# Patient Record
Sex: Female | Born: 1985 | Race: White | Hispanic: No | Marital: Married | Smoking: Never smoker
Health system: Southern US, Community
[De-identification: ages and names within clinical notes are randomized; demographics above are authoritative.]

## PROBLEM LIST (undated history)

## (undated) DIAGNOSIS — O039 Complete or unspecified spontaneous abortion without complication: Secondary | ICD-10-CM

## (undated) HISTORY — DX: Complete or unspecified spontaneous abortion without complication: O03.9

---

## 2007-12-30 ENCOUNTER — Emergency Department: Payer: Self-pay | Admitting: Emergency Medicine

## 2009-06-10 ENCOUNTER — Emergency Department: Payer: Self-pay | Admitting: Emergency Medicine

## 2011-07-05 ENCOUNTER — Emergency Department: Payer: Self-pay | Admitting: Emergency Medicine

## 2011-07-07 LAB — BETA STREP CULTURE(ARMC)

## 2015-01-05 ENCOUNTER — Emergency Department: Payer: Self-pay

## 2015-01-05 ENCOUNTER — Emergency Department
Admission: EM | Admit: 2015-01-05 | Discharge: 2015-01-05 | Disposition: A | Discharge status: Home / Self Care | Payer: Self-pay | Attending: Emergency Medicine | Admitting: Emergency Medicine

## 2015-01-05 ENCOUNTER — Encounter: Payer: Self-pay | Admitting: *Deleted

## 2015-01-05 DIAGNOSIS — O200 Threatened abortion: Secondary | ICD-10-CM | POA: Insufficient documentation

## 2015-01-05 DIAGNOSIS — Z3A01 Less than 8 weeks gestation of pregnancy: Secondary | ICD-10-CM | POA: Insufficient documentation

## 2015-01-05 DIAGNOSIS — N939 Abnormal uterine and vaginal bleeding, unspecified: Secondary | ICD-10-CM

## 2015-01-05 LAB — BASIC METABOLIC PANEL
ANION GAP: 7 (ref 5–15)
BUN: 5 mg/dL — AB (ref 6–20)
CO2: 25 mmol/L (ref 22–32)
CREATININE: 0.52 mg/dL (ref 0.44–1.00)
Calcium: 9 mg/dL (ref 8.9–10.3)
Chloride: 105 mmol/L (ref 101–111)
GFR calc Af Amer: >60 mL/min (ref >60)
Glucose, Bld: 100 mg/dL — ABNORMAL HIGH (ref 65–99)
Potassium: 3.6 mmol/L (ref 3.5–5.1)
Sodium: 137 mmol/L (ref 135–145)

## 2015-01-05 LAB — CBC WITH DIFFERENTIAL/PLATELET
Basophils Absolute: 0.1 10*3/uL (ref 0–0.1)
Basophils Relative: 1 %
EOS PCT: 1 %
Eosinophils Absolute: 0.2 10*3/uL (ref 0–0.7)
HCT: 38.9 % (ref 35.0–47.0)
Hemoglobin: 13.4 g/dL (ref 12.0–16.0)
LYMPHS PCT: 22 %
Lymphs Abs: 2.4 10*3/uL (ref 1.0–3.6)
MCH: 29.2 pg (ref 26.0–34.0)
MCHC: 34.5 g/dL (ref 32.0–36.0)
MCV: 84.8 fL (ref 80.0–100.0)
MONO ABS: 0.4 10*3/uL (ref 0.2–0.9)
MONOS PCT: 4 %
NEUTROS ABS: 7.9 10*3/uL — AB (ref 1.4–6.5)
Neutrophils Relative %: 72 %
Platelets: 297 10*3/uL (ref 150–440)
RBC: 4.59 MIL/uL (ref 3.80–5.20)
RDW: 13 % (ref 11.5–14.5)
WBC: 11 10*3/uL (ref 3.6–11.0)

## 2015-01-05 LAB — URINALYSIS COMPLETE WITH MICROSCOPIC (ARMC ONLY)
BACTERIA UA: NONE SEEN
Bilirubin Urine: NEGATIVE
GLUCOSE, UA: NEGATIVE mg/dL
LEUKOCYTES UA: NEGATIVE
Nitrite: NEGATIVE
Protein, ur: 30 mg/dL — AB
Specific Gravity, Urine: 1.021 (ref 1.005–1.030)
pH: 5 (ref 5.0–8.0)

## 2015-01-05 LAB — HCG, QUANTITATIVE, PREGNANCY: hCG, Beta Chain, Quant, S: 625 m[IU]/mL — ABNORMAL HIGH (ref <5)

## 2015-01-05 LAB — POCT PREGNANCY, URINE: PREG TEST UR: POSITIVE — AB

## 2015-01-05 NOTE — ED Notes (Signed)
Dr. Lord at bedside.  

## 2015-01-05 NOTE — ED Provider Notes (Signed)
[facility] Emergency Department Provider Note   ____________________________________________  Time seen:  I have reviewed the triage vital signs and the triage nursing note.  HISTORY  Chief Complaint Vaginal Bleeding   Historian Patient  HPI Barbara Huff is a 29 y.o. female is here for evaluation of vaginal bleeding since mid November, as well as intermittent left lower quadrant cramping. Patient works at health care facility and this afternoon felt lightheadedness and then nausea with near syncope.  No abdominal pain currently. No headache. No traumatic injury.Symptoms are mild. No exacerbating or alleviating factors.    No past medical history on file.  There are no active problems to display for this patient.   No past surgical history on file.  No current outpatient prescriptions on file.  Allergies Codeine  No family history on file.  Social History Social History  Substance Use Topics  . Smoking status: Never Smoker   . Smokeless tobacco: Not on file  . Alcohol Use: No    Review of Systems  Constitutional: Negative for fever. Eyes: Negative for visual changes. ENT: Negative for sore throat. Cardiovascular: Negative for chest pain. Respiratory: Negative for shortness of breath. Gastrointestinal: Negative for constipation or diarrhea. Genitourinary: Negative for dysuria. Musculoskeletal: Negative for back pain. Skin: Negative for rash. Neurological: Negative for headache. 10 point Review of Systems otherwise negative ____________________________________________   PHYSICAL EXAM:  VITAL SIGNS: ED Triage Vitals  Enc Vitals Group     BP 01/05/15 1936 121/78 mmHg     Pulse Rate 01/05/15 1936 76     Resp --      Temp 01/05/15 1936 98.1 F (36.7 C)     Temp Source 01/05/15 1936 Oral     SpO2 01/05/15 1936 100 %     Weight 01/05/15 1936 178 lb (80.74 kg)     Height 01/05/15 1936 4\' 11"  (1.499 m)     Head Cir --      Peak  Flow --      Pain Score 01/05/15 1936 6     Pain Loc --      Pain Edu? --      Excl. in GC? --      Constitutional: Alert and oriented. Well appearing and in no distress. Eyes: Conjunctivae are normal. PERRL. Normal extraocular movements. ENT   Head: Normocephalic and atraumatic.   Nose: No congestion/rhinnorhea.   Mouth/Throat: Mucous membranes are moist.   Neck: No stridor. Cardiovascular/Chest: Normal rate, regular rhythm.  No murmurs, rubs, or gallops. Respiratory: Normal respiratory effort without tachypnea nor retractions. Breath sounds are clear and equal bilaterally. No wheezes/rales/rhonchi. Gastrointestinal: Soft. No distention, no guarding, no rebound. Nontender  Genitourinary/rectal: Small amount of dark blood in vault. Cervix closed. Nontender cervix. No cervical motion tenderness. No adnexal mass or tenderness. Musculoskeletal: Nontender with normal range of motion in all extremities. No joint effusions.  No lower extremity tenderness.  No edema. Neurologic:  Normal speech and language. No gross or focal neurologic deficits are appreciated. Skin:  Skin is warm, dry and intact. No rash noted. Psychiatric: Mood and affect are normal. Speech and behavior are normal. Patient exhibits appropriate insight and judgment.  ____________________________________________   EKG I, Governor Rooksebecca Shresta Risden, MD, the attending physician have personally viewed and interpreted all ECGs.  None ____________________________________________  LABS (pertinent positives/negatives)  Urine pregnancy test positive CBC within normal limits Basic metabolic panel within normal limits Urinalysis trace ketones and otherwise negative Blood type AB+ Beta hCG 625  ____________________________________________  RADIOLOGY  All Xrays were viewed by me. Imaging interpreted by Radiologist.  Ultrasound complete less than 14 weeks with transvaginal:  IMPRESSION: Pregnancy location not visualized  sonographically. Differential diagnosis includes recent spontaneous abortion, IUP too early to visualize, and non-visualized ectopic pregnancy. Recommend close follow up of quantitative B-HCG levels, and follow up US as clinically warranted. __________________________________________  PROCEDURES  Procedure(s) performed: None  Critical Care performed: None  ____________________________________________   ED COURSE / ASSESSMENT AND PLAN  CONSULTATIONS: None  Pertinent labs & imaging results that were available during my care of the patient were reviewed by me and considered in my medical decision making (see chart for details).  Patient is here for her near syncopal episode and also bleeding for the last couple weeks. She is not significantly anemic. She is asymptomatic now here in the emergency department.  Pregnancy test positive with a new diagnosis. She is G1.  My clinical suspicion given the bleeding for the last several weeks as well as an nonvisualized pregnancy, I am most suspicious that this is probably a miscarriage. However given the possibility for early pregnancy or early tubal pregnancy, patient will be referred to OB/GYN for 2 day repeat beta hCG.  Patient / Family / Caregiver informed of clinical course, medical decision-making process, and agree with plan.   I discussed return precautions, follow-up instructions, and discharged instructions with patient and/or family.  ___________________________________________   FINAL CLINICAL IMPRESSION(S) / ED DIAGNOSES   Final diagnoses:  Vaginal bleeding  Miscarriage, threatened, early pregnancy       Governor Rooks, MD 01/05/15 2210

## 2015-01-05 NOTE — ED Notes (Signed)
Pt in ultrasound at this time. US tKoreaech notified to bring pt to treatment room 31 when ultrasound complete. US tech verbalized understanding.

## 2015-01-05 NOTE — ED Notes (Signed)
Pt has vaginal bleeding since November 15th.  Pt also has low abd pain with cramping.  Vomited once today.  No dysuria.  No back pain.

## 2015-01-05 NOTE — Discharge Instructions (Signed)
You were evaluated for vaginal bleeding and found to have positive pregnancy test in the ER.  Your pregnancy hormone level is relatively low, and your ultrasound does not show fetus/heartbeat.  This could indicate early pregnancy prior to the size able to be seen, or the middle of a miscarriage, or the early stages of a tubal pregnancy.  You need repeat bloodwork (Beta HCG) in 2 days -- call Dr. Oretha Milchherry's office ob gyn, to make appointment in 2 days.  Return to the emergency department for any new or worsening condition including any heavier bleeding, dizziness, passing out, or any other symptoms concerning to you.   Vaginal Bleeding During Pregnancy, First Trimester A small amount of bleeding (spotting) from the vagina is common in early pregnancy. Sometimes the bleeding is normal and is not a problem, and sometimes it is a sign of something serious. Be sure to tell your doctor about any bleeding from your vagina right away. HOME CARE  Watch your condition for any changes.  Follow your doctor's instructions about how active you can be.  If you are on bed rest:  You may need to stay in bed and only get up to use the bathroom.  You may be allowed to do some activities.  If you need help, make plans for someone to help you.  Write down:  The number of pads you use each day.  How often you change pads.  How soaked (saturated) your pads are.  Do not use tampons.  Do not douche.  Do not have sex or orgasms until your doctor says it is okay.  If you pass any tissue from your vagina, save the tissue so you can show it to your doctor.  Only take medicines as told by your doctor.  Do not take aspirin because it can make you bleed.  Keep all follow-up visits as told by your doctor. GET HELP IF:   You bleed from your vagina.  You have cramps.  You have labor pains.  You have a fever that does not go away after you take medicine. GET HELP RIGHT AWAY IF:   You have very bad  cramps in your back or belly (abdomen).  You pass large clots or tissue from your vagina.  You bleed more.  You feel light-headed or weak.  You pass out (faint).  You have chills.  You are leaking fluid or have a gush of fluid from your vagina.  You pass out while pooping (having a bowel movement). MAKE SURE YOU:  Understand these instructions.  Will watch your condition.  Will get help right away if you are not doing well or get worse.   This information is not intended to replace advice given to you by your health care provider. Make sure you discuss any questions you have with your health care provider.   Document Released: 06/02/2013 Document Reviewed: 06/02/2013 Elsevier Interactive Patient Education [employer]2016 Elsevier Inc.

## 2015-01-07 ENCOUNTER — Encounter: Payer: Self-pay | Admitting: Obstetrics and Gynecology

## 2015-01-07 ENCOUNTER — Telehealth: Payer: Self-pay

## 2015-01-07 ENCOUNTER — Ambulatory Visit (INDEPENDENT_AMBULATORY_CARE_PROVIDER_SITE_OTHER): Payer: Self-pay | Admitting: Obstetrics and Gynecology

## 2015-01-07 ENCOUNTER — Other Ambulatory Visit: Payer: Self-pay | Admitting: Obstetrics and Gynecology

## 2015-01-07 VITALS — BP 129/76 | HR 86 | Ht 59.5 in | Wt 174.6 lb

## 2015-01-07 DIAGNOSIS — O021 Missed abortion: Secondary | ICD-10-CM

## 2015-01-07 DIAGNOSIS — O200 Threatened abortion: Secondary | ICD-10-CM

## 2015-01-07 LAB — BETA HCG QUANT (REF LAB): HCG QUANT: 345 m[IU]/mL

## 2015-01-07 MED ORDER — METHYLERGONOVINE MALEATE 0.2 MG PO TABS: 0.2000 mg | ORAL_TABLET | Freq: Four times a day (QID) | ORAL | Status: DC

## 2015-01-07 NOTE — Telephone Encounter (Signed)
Informed pt of miscarriage, and gave bleeding precautions. Advised pt on the need to use contraception for at least 3 mos before trying to conceive again. RX sent in.

## 2015-01-07 NOTE — Telephone Encounter (Signed)
-----   Message from Hildred LaserAnika Cherry, MD sent at 01/07/2015  4:07 PM EST ----- Please inform patient that BHCG levels are trending downward, looks like she is having a miscarriage.  Can prescribe Methergine 0.2 mg q 6 hr x 24 hrs (4 tablets) if she notes that her bleeding is still heavy.

## 2015-01-07 NOTE — Telephone Encounter (Signed)
ERROR

## 2015-01-10 LAB — ABO/RH: ABO/RH(D): AB POS

## 2015-01-11 ENCOUNTER — Encounter: Payer: Self-pay | Admitting: Obstetrics and Gynecology

## 2015-01-11 NOTE — Progress Notes (Signed)
GYNECOLOGY PROGRESS NOTE  Subjective:    Patient ID: Barbara Huff, female    DOB: [birth year], 29 y.o.   MRN: [id number]  HPI  Patient is a 29 y.o. G1P0 female who presents for follow from Emergency Room visit 2 days ago for heavy abnormal vaginal bleeding.  Bleeding has been ongoing since approximately mid November, was like a light period, however over last 2 weekends, bleeding increased and was associated with pelvic cramping.  Was discovered to be pregnant in Emergency Room but could not identify IUP due to low BHCG levels. Was instructed to f/u with GYN.    History reviewed. No pertinent past medical history.  No family history on file.  History reviewed. No pertinent past surgical history.  Social History   Social History  . Marital Status: Single    Spouse Name: N/A  . Number of Children: N/A  . Years of Education: N/A   Occupational History  . Not on file.   Social History Main Topics  . Smoking status: Never Smoker   . Smokeless tobacco: Not on file  . Alcohol Use: No  . Drug Use: Not on file  . Sexual Activity: Yes    Birth Control/ Protection: None   Other Topics Concern  . Not on file   Social History Narrative    Allergies  Allergen Reactions  . Codeine Nausea And Vomiting    No current outpatient prescriptions on file prior to visit.   No current facility-administered medications on file prior to visit.     Review of Systems Pertinent items noted in HPI and remainder of comprehensive ROS otherwise negative.   Objective:   Blood pressure 129/76, pulse 86, height 4' 11.5" (1.511 m), weight 174 lb 9.6 oz (79.198 kg), last menstrual period 12/15/2014. General appearance: alert and no distress Abdomen: soft, non-tender; bowel sounds normal; no masses,  no organomegaly Pelvic: external genitalia normal.  Vagina with small amount of dark red blood in vaginal vault, no clots.  Cervix closed, nontender, no lesions. Uterus ~ 6 week size, mobile. No adnexal  masses or tenderness. Extremities: extremities normal, atraumatic, no cyanosis or edema Neurologic: Grossly normal    Labs:  Lab Results  Component Value Date   ABORH AB POS 01/05/2015   Results for [name], Huff (MRN [id number]) as of 01/11/2015 10:49  Ref. Range 01/05/2015 19:47  HCG, Beta Chain, Quant, S Latest Ref Range: <5 mIU/mL 625 (H)       Component Value Date/Time   WBC 11.0 01/05/2015 1947   RBC 4.59 01/05/2015 1947   HGB 13.4 01/05/2015 1947   HCT 38.9 01/05/2015 1947   PLT 297 01/05/2015 1947   MCV 84.8 01/05/2015 1947   MCH 29.2 01/05/2015 1947   MCHC 34.5 01/05/2015 1947   RDW 13.0 01/05/2015 1947   LYMPHSABS 2.4 01/05/2015 1947   MONOABS 0.4 01/05/2015 1947   EOSABS 0.2 01/05/2015 1947   BASOSABS 0.1 01/05/2015 1947    Imaging (01/05/2015 Pelvic Ultrasound): FINDINGS: No intrauterine gestational sac or other fluid collection visualized in endometrial cavity. Endometrial thickness measures 5 mm. No fibroids identified.  Both ovaries are normal in appearance. No adnexal mass identified. Trace amount of free fluid noted.  IMPRESSION: Pregnancy location not visualized sonographically. Differential diagnosis includes recent spontaneous abortion, IUP too early to visualize, and non-visualized ectopic pregnancy. Recommend close follow up of quantitative B-HCG levels, and follow up US as clinically warranted. Assessment:   Threatened abortion  Plan:   Patient with early IUP with bleeding,  vs SAB, cannot r/o ectopic. Discussion had with patient regarding differential diagnosis.  Discussed bleeding and ectopic precautions.  For repeat BHCG today.  Will notify patient by phone of results and determine subsequent management based on results.  Advised on Tylenol prn for cramping.    Hildred LaserAnika Renan Danese, MD [facility]

## 2015-01-14 ENCOUNTER — Telehealth: Payer: Self-pay | Admitting: Obstetrics and Gynecology

## 2015-01-14 NOTE — Telephone Encounter (Signed)
Patient would like Methergine sent [facility] in [place] [address] instead. It is cheaper than [facility]. Thanks

## 2015-01-14 NOTE — Telephone Encounter (Signed)
Spoke with pt inquired about her bleeding as medication is to help with heavy bleeding and was prescribed over a week ago. Pt states that is having very minimal bleeding, only seeing a light pink ting when she wipes in the morning. Advised pt that there is no need for her to pick up the medication as it is for heavy bleeding. Pt gave verbal understanding.

## 2015-05-19 ENCOUNTER — Ambulatory Visit (INDEPENDENT_AMBULATORY_CARE_PROVIDER_SITE_OTHER): Payer: Self-pay | Admitting: Obstetrics and Gynecology

## 2015-05-19 ENCOUNTER — Encounter: Payer: Self-pay | Admitting: Obstetrics and Gynecology

## 2015-05-19 VITALS — BP 127/78 | HR 101 | Ht 59.5 in | Wt 179.0 lb

## 2015-05-19 DIAGNOSIS — N912 Amenorrhea, unspecified: Secondary | ICD-10-CM

## 2015-05-19 DIAGNOSIS — Z8759 Personal history of other complications of pregnancy, childbirth and the puerperium: Secondary | ICD-10-CM | POA: Insufficient documentation

## 2015-05-19 DIAGNOSIS — Z8742 Personal history of other diseases of the female genital tract: Secondary | ICD-10-CM

## 2015-05-19 DIAGNOSIS — O26851 Spotting complicating pregnancy, first trimester: Secondary | ICD-10-CM

## 2015-05-19 LAB — POCT URINE PREGNANCY: PREG TEST UR: POSITIVE — AB

## 2015-05-19 NOTE — Progress Notes (Signed)
GYN ENCOUNTER NOTE  Subjective:       Barbara Huff is a 30 y.o. G22P0010 female is here for gynecologic evaluation of the following issues:  1.Threatened abortion  Cycles are regular.    LMP 04/09/2015 Positive UPT Current EGA 5.5 weeks  Patient has noted nausea with occasional vomiting, increased breast tenderness. She denies pelvic pain, cramping. She does report episode of light vaginal spotting in the past 24 hours. Past obstetric history is notable for SAB in December 2016 Blood type: AB+  Gynecologic History Patient's last menstrual period was 04/09/2015 (exact date). Contraception: none  Obstetric History OB History  Gravida Para Term Preterm AB SAB TAB Ectopic Multiple Living  # Outcome Date GA Lbr Len/2nd Weight Sex Delivery Anes PTL Lv  1 SAB 12/2014              Past Medical History  Diagnosis Date  . SAB (spontaneous abortion)     Past Surgical History  Procedure Laterality Date  . No past surgeries      No current outpatient prescriptions on file prior to visit.   No current facility-administered medications on file prior to visit.    Allergies  Allergen Reactions  . Codeine Nausea And Vomiting    Social History   Social History  . Marital Status: Single    Spouse Name: N/A  . Number of Children: N/A  . Years of Education: N/A   Occupational History  . Not on file.   Social History Main Topics  . Smoking status: Never Smoker   . Smokeless tobacco: Not on file  . Alcohol Use: No  . Drug Use: No  . Sexual Activity: Yes    Birth Control/ Protection: None   Other Topics Concern  . Not on file   Social History Narrative    Family History  Problem Relation Age of Onset  . Asthma Maternal Grandmother   . Colon cancer Maternal Grandmother   . Diabetes Maternal Grandmother   . Ovarian cancer Neg Hx     The following portions of the patient's history were reviewed and updated as appropriate: allergies, current  medications, past family history, past medical history, past social history, past surgical history and problem list.  Review of Systems Review of Systems - General ROS: negative for - chills, fatigue, fever, hot flashes, malaise or night sweats Hematological and Lymphatic ROS: negative for - bleeding problems or swollen lymph nodes Gastrointestinal ROS: negative for - abdominal pain, blood in stools, change in bowel habits. POSITIVE- nausea/vomiting Musculoskeletal ROS: negative for - joint pain, muscle pain or muscular weakness Genito-Urinary ROS: negative for - change in menstrual cycle, dysmenorrhea, dyspareunia, dysuria, genital discharge, genital ulcers, hematuria, incontinence, irregular/heavy menses, nocturia or pelvic pain  Objective:   BP 127/78 mmHg  Pulse 101  Ht 4' 11.5" (1.511 m)  Wt 179 lb (81.194 kg)  BMI 35.56 kg/m2  LMP 04/09/2015 (Exact Date) CONSTITUTIONAL: Well-developed, well-nourished female in no acute distress.  HENT:  Normocephalic, atraumatic.  NECK: Normal range of motion, supple, no masses.  Normal thyroid.  SKIN: Skin is warm and dry. No rash noted. Not diaphoretic. No erythema. No pallor. NEUROLGIC: Alert and oriented to person, place, and time. PSYCHIATRIC: Normal mood and affect. Normal behavior. Normal judgment and thought content. CARDIOVASCULAR:Not Examined RESPIRATORY: Not Examined BREASTS: Not Examined ABDOMEN: Soft, non distended; Non tender.  No Organomegaly. PELVIC:  External Genitalia: Normal  BUS: Normal  Vagina: Normal; Minimal blood noted in vaginal vault  Cervix: Normal; no lesions; no blood loss; no cervical motion tenderness  Uterus: Top Normal size, shape,consistency, mobile, nontender  Adnexa: Normal; nonpalpable and nontender  RV: Normal external exam  Bladder: Nontender MUSCULOSKELETAL: Normal range of motion. No tenderness.  No cyanosis, clubbing, or edema.     Assessment:   1. Amenorrhea - POCT urine pregnancy  2.  Spotting in first trimester - Beta HCG, Quant - Beta HCG, Quant; Future   3. History of SAB  Plan:   1. Quantitative hCG 05/19/2015 and 05/21/2015 2. SAB/ectopic precautions given 3. Pelvic ultrasound will be ordered once discriminatory zone is reached (greater than 2000 miu).  A total of 15 minutes were spent face-to-face with the patient during this encounter and over half of that time dealt with counseling and coordination of care.  Herold HarmsMartin A Defrancesco, MD  Note: This dictation was prepared with Dragon dictation along with smaller phrase technology. Any transcriptional errors that result from this process are unintentional.

## 2015-05-19 NOTE — Patient Instructions (Signed)
1. Quantitative hCG titer today and in 2 days 2. Return if increased bleeding and/or pain develops 3. He will be notified by phone regarding blood test results and further management planning for scheduling of ultrasound

## 2015-05-20 ENCOUNTER — Telehealth: Payer: Self-pay | Admitting: *Deleted

## 2015-05-20 LAB — BETA HCG QUANT (REF LAB): hCG Quant: 1050 m[IU]/mL

## 2015-05-20 NOTE — Telephone Encounter (Signed)
Patient called and is inquiring about her lab results . Patient is requesting a call back. Call back number  [phone].

## 2015-05-20 NOTE — Telephone Encounter (Signed)
Pt aware of beta results. Will have 2nd beta tomrorrow. Per mad if beta is above 2000 will order dating scan.

## 2015-05-21 ENCOUNTER — Other Ambulatory Visit: Payer: Self-pay

## 2015-05-21 DIAGNOSIS — O26851 Spotting complicating pregnancy, first trimester: Secondary | ICD-10-CM

## 2015-05-22 LAB — BETA HCG QUANT (REF LAB): hCG Quant: 1500 m[IU]/mL

## 2015-05-24 ENCOUNTER — Telehealth: Payer: Self-pay | Admitting: Obstetrics and Gynecology

## 2015-05-24 DIAGNOSIS — O209 Hemorrhage in early pregnancy, unspecified: Secondary | ICD-10-CM

## 2015-05-24 NOTE — Telephone Encounter (Signed)
Pt aware of both beta results- Per mad pt is to have beta before appt on wed. U/s scheduled for 9:00. Will see mad at 11:00.

## 2015-05-24 NOTE — Telephone Encounter (Signed)
HEY WHEN I TOLD HER TO KEEP HER EED APPT W/ DR DE, SHE SAID HER LABS WERE SUPPOSED TO COME BACK TODAY AND YOU WERE GOING TO CALL HER. i TOLD HER THAT DR DEF WOULD DISCUSS THE LABS ON WED. SHE WANTED ME TO SEND A NOTE TO YOU TO CALL. SORRY

## 2015-05-26 ENCOUNTER — Encounter: Payer: Self-pay | Admitting: Obstetrics and Gynecology

## 2015-05-26 ENCOUNTER — Ambulatory Visit (INDEPENDENT_AMBULATORY_CARE_PROVIDER_SITE_OTHER): Payer: Self-pay | Admitting: Obstetrics and Gynecology

## 2015-05-26 ENCOUNTER — Ambulatory Visit (INDEPENDENT_AMBULATORY_CARE_PROVIDER_SITE_OTHER): Payer: Self-pay

## 2015-05-26 ENCOUNTER — Other Ambulatory Visit: Payer: Self-pay | Admitting: Obstetrics and Gynecology

## 2015-05-26 VITALS — BP 109/75 | HR 73 | Wt 176.4 lb

## 2015-05-26 DIAGNOSIS — O4691 Antepartum hemorrhage, unspecified, first trimester: Secondary | ICD-10-CM

## 2015-05-26 DIAGNOSIS — O209 Hemorrhage in early pregnancy, unspecified: Secondary | ICD-10-CM

## 2015-05-26 DIAGNOSIS — O200 Threatened abortion: Secondary | ICD-10-CM

## 2015-05-26 NOTE — Patient Instructions (Signed)
1. Quantitative hCG today 2. Return in 1 week for repeat ultrasound and further management planning 3. Return if vaginal bleeding or pelvic pain developed

## 2015-05-26 NOTE — Progress Notes (Addendum)
Chief complaint: 1. Threatened abortion  Patient presents for follow-up. Quantitative hCG studies:  05/19/2015 1050  05/21/2015 1500  Ultrasound today: Normal without evidence of intrauterine pregnancy or adnexal mass or free fluid  Patient had resolution of first trimester spotting after visit on 05/19/2015. She is not experiencing any pelvic pain. She continues to have breast tenderness and nausea.  Previous history is notable for ectopic pregnancy right tube; no treatment; spontaneous resolution  OBJECTIVE: BP 109/75 mmHg  Pulse 73  Wt 176 lb 7 oz (80.032 kg)  LMP 04/09/2015 (Exact Date) Pleasant white female in no acute distress. Visual exam-deferred  ASSESSMENT: 1. Threatened abortion 2. Pelvic ultrasound without evidence of intrauterine pregnancy; no signs or symptoms of ectopic pregnancy at this time; apparent appropriate rise in hCG titer over 48 hours as noted.  PLAN: 1. Quantitative hCG titer today 2. Return in 1 week for pelvic ultrasound and further management planning 3. Ectopic precautions given  A total of 15 minutes were spent face-to-face with the patient during this encounter and over half of that time dealt with counseling and coordination of care.  Herold HarmsMartin A Maymunah Stegemann, MD  Note: This dictation was prepared with Dragon dictation along with smaller phrase technology. Any transcriptional errors that result from this process are unintentional.

## 2015-05-27 LAB — BETA HCG QUANT (REF LAB): HCG QUANT: 2515 m[IU]/mL

## 2015-06-02 ENCOUNTER — Other Ambulatory Visit (INDEPENDENT_AMBULATORY_CARE_PROVIDER_SITE_OTHER): Payer: Self-pay

## 2015-06-02 ENCOUNTER — Other Ambulatory Visit: Payer: Self-pay

## 2015-06-02 ENCOUNTER — Ambulatory Visit: Payer: Self-pay | Admitting: Obstetrics and Gynecology

## 2015-06-02 ENCOUNTER — Ambulatory Visit (INDEPENDENT_AMBULATORY_CARE_PROVIDER_SITE_OTHER): Payer: Self-pay

## 2015-06-02 DIAGNOSIS — O200 Threatened abortion: Secondary | ICD-10-CM

## 2015-06-02 DIAGNOSIS — O209 Hemorrhage in early pregnancy, unspecified: Secondary | ICD-10-CM

## 2015-06-02 DIAGNOSIS — O4691 Antepartum hemorrhage, unspecified, first trimester: Secondary | ICD-10-CM

## 2015-06-03 ENCOUNTER — Telehealth: Payer: Self-pay | Admitting: *Deleted

## 2015-06-03 ENCOUNTER — Other Ambulatory Visit: Payer: Self-pay

## 2015-06-03 ENCOUNTER — Ambulatory Visit: Payer: Self-pay | Admitting: Obstetrics and Gynecology

## 2015-06-03 LAB — BETA HCG QUANT (REF LAB): HCG QUANT: 5846 m[IU]/mL

## 2015-06-03 NOTE — Telephone Encounter (Signed)
Patient called and was inquiring about her lab results. She is requesting a call back. Call back number [phone].

## 2015-06-03 NOTE — Telephone Encounter (Signed)
PT AWARE OF BETA RESULTS. Will contact office if having heavy vaginal bleeding or severe cramps. F/u with mad next week after u/s.

## 2015-06-10 ENCOUNTER — Ambulatory Visit: Payer: Self-pay | Admitting: Certified Registered Nurse Anesthetist

## 2015-06-10 ENCOUNTER — Ambulatory Visit (INDEPENDENT_AMBULATORY_CARE_PROVIDER_SITE_OTHER): Payer: Self-pay | Admitting: Obstetrics and Gynecology

## 2015-06-10 ENCOUNTER — Encounter
Admission: RE | Disposition: A | Discharge status: Home / Self Care | Payer: Self-pay | Source: Ambulatory Visit | Attending: Obstetrics and Gynecology

## 2015-06-10 ENCOUNTER — Ambulatory Visit (INDEPENDENT_AMBULATORY_CARE_PROVIDER_SITE_OTHER): Payer: Self-pay

## 2015-06-10 ENCOUNTER — Encounter: Payer: Self-pay | Admitting: *Deleted

## 2015-06-10 ENCOUNTER — Encounter: Payer: Self-pay | Admitting: Obstetrics and Gynecology

## 2015-06-10 ENCOUNTER — Ambulatory Visit
Admission: RE | Admit: 2015-06-10 | Discharge: 2015-06-10 | Disposition: A | Discharge status: Home / Self Care | Payer: Self-pay | Source: Ambulatory Visit | Attending: Obstetrics and Gynecology | Admitting: Obstetrics and Gynecology

## 2015-06-10 ENCOUNTER — Telehealth: Payer: Self-pay | Admitting: *Deleted

## 2015-06-10 VITALS — BP 120/78 | HR 76 | Ht 59.5 in | Wt 179.0 lb

## 2015-06-10 DIAGNOSIS — Z825 Family history of asthma and other chronic lower respiratory diseases: Secondary | ICD-10-CM | POA: Insufficient documentation

## 2015-06-10 DIAGNOSIS — O00109 Unspecified tubal pregnancy without intrauterine pregnancy: Secondary | ICD-10-CM

## 2015-06-10 DIAGNOSIS — O009 Unspecified ectopic pregnancy without intrauterine pregnancy: Secondary | ICD-10-CM | POA: Insufficient documentation

## 2015-06-10 DIAGNOSIS — O001 Tubal pregnancy without intrauterine pregnancy: Secondary | ICD-10-CM

## 2015-06-10 DIAGNOSIS — N736 Female pelvic peritoneal adhesions (postinfective): Secondary | ICD-10-CM | POA: Insufficient documentation

## 2015-06-10 DIAGNOSIS — O200 Threatened abortion: Secondary | ICD-10-CM

## 2015-06-10 DIAGNOSIS — Z833 Family history of diabetes mellitus: Secondary | ICD-10-CM | POA: Insufficient documentation

## 2015-06-10 DIAGNOSIS — Z800 Family history of malignant neoplasm of digestive organs: Secondary | ICD-10-CM | POA: Insufficient documentation

## 2015-06-10 DIAGNOSIS — Z885 Allergy status to narcotic agent status: Secondary | ICD-10-CM | POA: Insufficient documentation

## 2015-06-10 HISTORY — PX: LAPAROSCOPIC LYSIS OF ADHESIONS: SHX5905

## 2015-06-10 HISTORY — DX: Unspecified tubal pregnancy without intrauterine pregnancy: O00.109

## 2015-06-10 HISTORY — PX: DIAGNOSTIC LAPAROSCOPY WITH REMOVAL OF ECTOPIC PREGNANCY: SHX6449

## 2015-06-10 LAB — CBC WITH DIFFERENTIAL/PLATELET
BASOS ABS: 0 10*3/uL (ref 0–0.1)
BASOS PCT: 1 %
EOS PCT: 2 %
Eosinophils Absolute: 0.1 10*3/uL (ref 0–0.7)
HCT: 37.3 % (ref 35.0–47.0)
Hemoglobin: 12.7 g/dL (ref 12.0–16.0)
Lymphocytes Relative: 29 %
Lymphs Abs: 2.1 10*3/uL (ref 1.0–3.6)
MCH: 28.4 pg (ref 26.0–34.0)
MCHC: 34 g/dL (ref 32.0–36.0)
MCV: 83.4 fL (ref 80.0–100.0)
MONO ABS: 0.4 10*3/uL (ref 0.2–0.9)
Monocytes Relative: 6 %
Neutro Abs: 4.5 10*3/uL (ref 1.4–6.5)
Neutrophils Relative %: 62 %
Platelets: 298 10*3/uL (ref 150–440)
RBC: 4.48 MIL/uL (ref 3.80–5.20)
RDW: 13.7 % (ref 11.5–14.5)
WBC: 7.2 10*3/uL (ref 3.6–11.0)

## 2015-06-10 LAB — TYPE AND SCREEN
ABO/RH(D): AB POS
Antibody Screen: NEGATIVE

## 2015-06-10 SURGERY — LAPAROSCOPY, WITH ECTOPIC PREGNANCY SURGICAL TREATMENT
Anesthesia: General | Wound class: Clean Contaminated

## 2015-06-10 MED ORDER — PROPOFOL 10 MG/ML IV BOLUS
INTRAVENOUS | Status: DC | PRN
  Administered 2015-06-10: 200 mg via INTRAVENOUS

## 2015-06-10 MED ORDER — SUCCINYLCHOLINE CHLORIDE 20 MG/ML IJ SOLN
INTRAMUSCULAR | Status: DC | PRN
  Administered 2015-06-10: 100 mg via INTRAVENOUS

## 2015-06-10 MED ORDER — ACETAMINOPHEN 10 MG/ML IV SOLN
INTRAVENOUS | Status: DC | PRN
Start: 2015-06-10 — End: 2015-06-10
  Administered 2015-06-10: 1000 mg via INTRAVENOUS

## 2015-06-10 MED ORDER — KETOROLAC TROMETHAMINE 30 MG/ML IJ SOLN
INTRAMUSCULAR | Status: DC | PRN
  Administered 2015-06-10: 30 mg via INTRAVENOUS

## 2015-06-10 MED ORDER — MIDAZOLAM HCL 2 MG/2ML IJ SOLN
INTRAMUSCULAR | Status: DC | PRN
  Administered 2015-06-10: 2 mg via INTRAVENOUS

## 2015-06-10 MED ORDER — ONDANSETRON HCL 4 MG/2ML IJ SOLN
4.0000 mg | Freq: Once | INTRAMUSCULAR | Status: AC | PRN
  Administered 2015-06-10: 4 mg via INTRAVENOUS

## 2015-06-10 MED ORDER — LIDOCAINE HCL (CARDIAC) 20 MG/ML IV SOLN
INTRAVENOUS | Status: DC | PRN
  Administered 2015-06-10: 60 mg via INTRAVENOUS

## 2015-06-10 MED ORDER — OXYCODONE-ACETAMINOPHEN 5-325 MG PO TABS: 1.0000 | ORAL_TABLET | ORAL | Status: DC | PRN

## 2015-06-10 MED ORDER — SUGAMMADEX SODIUM 200 MG/2ML IV SOLN
INTRAVENOUS | Status: DC | PRN
  Administered 2015-06-10: 175 mg via INTRAVENOUS

## 2015-06-10 MED ORDER — ROCURONIUM BROMIDE 100 MG/10ML IV SOLN
INTRAVENOUS | Status: DC | PRN
  Administered 2015-06-10 (×2): 5 mg via INTRAVENOUS
  Administered 2015-06-10: 15 mg via INTRAVENOUS

## 2015-06-10 MED ORDER — EPHEDRINE SULFATE 50 MG/ML IJ SOLN
INTRAMUSCULAR | Status: DC | PRN
  Administered 2015-06-10: 5 mg via INTRAVENOUS

## 2015-06-10 MED ORDER — IBUPROFEN 800 MG PO TABS: 800.0000 mg | ORAL_TABLET | Freq: Three times a day (TID) | ORAL | Status: DC

## 2015-06-10 MED ORDER — ONDANSETRON HCL 4 MG/2ML IJ SOLN
INTRAMUSCULAR | Status: DC | PRN
  Administered 2015-06-10: 4 mg via INTRAVENOUS

## 2015-06-10 MED ORDER — DEXAMETHASONE SODIUM PHOSPHATE 10 MG/ML IJ SOLN
INTRAMUSCULAR | Status: DC | PRN
  Administered 2015-06-10: 10 mg via INTRAVENOUS

## 2015-06-10 MED ORDER — FENTANYL CITRATE (PF) 100 MCG/2ML IJ SOLN
INTRAMUSCULAR | Status: AC
  Filled 2015-06-10: qty 2

## 2015-06-10 MED ORDER — ONDANSETRON HCL 4 MG/2ML IJ SOLN
INTRAMUSCULAR | Status: AC
  Filled 2015-06-10: qty 2

## 2015-06-10 MED ORDER — LACTATED RINGERS IV SOLN
INTRAVENOUS | Status: DC
Start: 2015-06-10 — End: 2015-06-10
  Administered 2015-06-10 (×2): via INTRAVENOUS

## 2015-06-10 MED ORDER — ACETAMINOPHEN 10 MG/ML IV SOLN
INTRAVENOUS | Status: AC
  Filled 2015-06-10: qty 100

## 2015-06-10 MED ORDER — FENTANYL CITRATE (PF) 100 MCG/2ML IJ SOLN
25.0000 ug | INTRAMUSCULAR | Status: DC | PRN
  Administered 2015-06-10 (×2): 25 ug via INTRAVENOUS

## 2015-06-10 MED ORDER — FENTANYL CITRATE (PF) 100 MCG/2ML IJ SOLN
INTRAMUSCULAR | Status: DC | PRN
  Administered 2015-06-10 (×2): 50 ug via INTRAVENOUS

## 2015-06-10 MED ORDER — OXYCODONE-ACETAMINOPHEN 5-325 MG PO TABS
ORAL_TABLET | ORAL | Status: AC
  Filled 2015-06-10: qty 1

## 2015-06-10 SURGICAL SUPPLY — 37 items
ANCHOR TIS RET SYS 235ML (MISCELLANEOUS) IMPLANT
BLADE SURG SZ11 CARB STEEL (BLADE) ×4 IMPLANT
CANISTER SUCT 1200ML W/VALVE (MISCELLANEOUS) ×4 IMPLANT
CATH ROBINSON RED A/P 16FR (CATHETERS) ×4 IMPLANT
CHLORAPREP W/TINT 26ML (MISCELLANEOUS) ×4 IMPLANT
DRSG TEGADERM 2-3/8X2-3/4 SM (GAUZE/BANDAGES/DRESSINGS) ×12 IMPLANT
GLOVE BIO SURGEON STRL SZ8 (GLOVE) ×12 IMPLANT
GLOVE INDICATOR 8.0 STRL GRN (GLOVE) ×4 IMPLANT
GOWN STRL REUS W/ TWL LRG LVL3 (GOWN DISPOSABLE) ×4 IMPLANT
GOWN STRL REUS W/ TWL XL LVL3 (GOWN DISPOSABLE) ×2 IMPLANT
GOWN STRL REUS W/TWL LRG LVL3 (GOWN DISPOSABLE) ×4
GOWN STRL REUS W/TWL XL LVL3 (GOWN DISPOSABLE) ×2
IRRIGATION STRYKERFLOW (MISCELLANEOUS) IMPLANT
IRRIGATOR STRYKERFLOW (MISCELLANEOUS)
IV LACTATED RINGERS 1000ML (IV SOLUTION) ×4 IMPLANT
KIT PINK PAD W/HEAD ARE REST (MISCELLANEOUS) ×4
KIT PINK PAD W/HEAD ARM REST (MISCELLANEOUS) ×2 IMPLANT
KIT RM TURNOVER CYSTO AR (KITS) ×4 IMPLANT
LABEL OR SOLS (LABEL) ×4 IMPLANT
LIQUID BAND (GAUZE/BANDAGES/DRESSINGS) IMPLANT
NS IRRIG 1000ML POUR BTL (IV SOLUTION) ×4 IMPLANT
NS IRRIG 500ML POUR BTL (IV SOLUTION) ×4 IMPLANT
PACK GYN LAPAROSCOPIC (MISCELLANEOUS) ×4 IMPLANT
PAD OB MATERNITY 4.3X12.25 (PERSONAL CARE ITEMS) ×4 IMPLANT
PAD PREP 24X41 OB/GYN DISP (PERSONAL CARE ITEMS) ×4 IMPLANT
POUCH ENDO CATCH 10MM SPEC (MISCELLANEOUS) ×4 IMPLANT
SCISSORS METZENBAUM CVD 33 (INSTRUMENTS) IMPLANT
SHEARS HARMONIC ACE PLUS 36CM (ENDOMECHANICALS) ×4 IMPLANT
SLEEVE ENDOPATH XCEL 5M (ENDOMECHANICALS) IMPLANT
SUT MNCRL 4-0 (SUTURE) ×2
SUT MNCRL 4-0 27XMFL (SUTURE) ×2
SUT VIC AB 0 UR5 27 (SUTURE) IMPLANT
SUT VICRYL 0 AB UR-6 (SUTURE) ×4 IMPLANT
SUTURE MNCRL 4-0 27XMF (SUTURE) ×2 IMPLANT
TROCAR ENDO BLADELESS 11MM (ENDOMECHANICALS) IMPLANT
TROCAR XCEL NON-BLD 5MMX100MML (ENDOMECHANICALS) ×4 IMPLANT
TUBING INSUFFLATOR HI FLOW (MISCELLANEOUS) ×4 IMPLANT

## 2015-06-10 NOTE — Anesthesia Preprocedure Evaluation (Addendum)
Anesthesia Evaluation  Patient identified by MRN, date of birth, ID band Patient awake    Reviewed: Allergy & Precautions, H&P , NPO status , Patient's Chart, lab work & pertinent test results, reviewed documented beta blocker date and time   History of Anesthesia Complications Negative for: history of anesthetic complications  Airway Mallampati: II  TM Distance: >3 FB Neck ROM: full    Dental no notable dental hx. (+) Teeth Intact   Pulmonary neg pulmonary ROS,    Pulmonary exam normal breath sounds clear to auscultation       Cardiovascular Exercise Tolerance: Good negative cardio ROS Normal cardiovascular exam Rhythm:regular Rate:Normal     Neuro/Psych negative neurological ROS  negative psych ROS   GI/Hepatic negative GI ROS, Neg liver ROS,   Endo/Other  negative endocrine ROS  Renal/GU negative Renal ROS  negative genitourinary   Musculoskeletal   Abdominal   Peds  Hematology negative hematology ROS (+)   Anesthesia Other Findings Past Medical History:   SAB (spontaneous abortion)                                   Reproductive/Obstetrics (+) Pregnancy                             Anesthesia Physical Anesthesia Plan  ASA: II  Anesthesia Plan: General   Post-op Pain Management:    Induction:   Airway Management Planned:   Additional Equipment:   Intra-op Plan:   Post-operative Plan:   Informed Consent: I have reviewed the patients History and Physical, chart, labs and discussed the procedure including the risks, benefits and alternatives for the proposed anesthesia with the patient or authorized representative who has indicated his/her understanding and acceptance.   Dental Advisory Given  Plan Discussed with: Anesthesiologist, CRNA and Surgeon  Anesthesia Plan Comments:         Anesthesia Quick Evaluation

## 2015-06-10 NOTE — Op Note (Signed)
OPERATIVE NOTE:  Barbara Huff PROCEDURE DATE: 06/10/2015   PREOPERATIVE DIAGNOSIS:  Suspected Lt Tubal Pregnancy  POSTOPERATIVE DIAGNOSIS:  1. Left tubal pregnancy, unruptured 2. Pelvic adhesive disease  PROCEDURE:  Laparoscopic adhesiolysis with excision of left tubal pregnancy (left distal partial salpingectomy)  SURGEON:  Herold HarmsMartin A Leland Raver, MD ASSISTANTS: PA-S Verlin FesterKelly Rathburn, Lindsey Penninger ANESTHESIA: General INDICATIONS: 30 y.o. gravida 2 para 0010, presents for surgical management of suspected left tubal pregnancy. Serial quantitative hCG titers are rising suboptimally with the latest value 1500 international units. Serial pelvic ultrasounds demonstrated no intrauterine pregnancy; most recent ultrasound today demonstrated probable left tubal pregnancy.  FINDINGS:   2.5 cm distal left tubal pregnancy, unruptured; extensive pelvic adhesive disease with adhesions in the cul-de-sac, right adnexa involving omentum and pelvic sidewall, and left adnexa involving the tube, omentum, and small bowel. Moderate adhesiolysis was performed taking down adhesions in the cul-de-sac as well as in the adnexa bilaterally as well as along the right abdominal sidewall with adhesions between the omentum and the pelvic side. The uterus demonstrated a pedunculated 1 cm posterior lower uterine segment fibroid. The right ovaries were normal in appearance but did have adhesions to the pelvic sidewall which had to be lysed. Following adhesiolysis the ovaries and right tube contained a more normal anatomic orientation. The appendix appeared normal. Upper abdomen was notable for normal liver and gallbladder; diaphragm was normal.   I/O's: Total I/O In: 800 [I.V.:800] Out: 215 [Urine:200; Blood:15] COUNTS:  YES SPECIMENS:  1. Left fallopian tube segment including fimbria and tubal pregnancy  ANTIBIOTIC PROPHYLAXIS:N/A COMPLICATIONS: None immediate  PROCEDURE IN DETAIL: The patient was brought to the  operating room and was placed in the supine position. General endotracheal anesthesia was induced without difficulty. She was placed in the dorsal lithotomy position using the bumblebee stirrups. A ChloraPrep and Betadine abdominal perineal intravaginal prep and drape was performed in a fashion. Timeout was completed. Red Robinson catheter was used to drain 200 mL of urine from the bladder. TENACULUM was placed on the cervix to facilitate uterine manipulation. Subumbilical vertical incision 5 mm in length was made. The Optiview laparoscopic trocar system was used to place a 5 mm port directly into the abdominal pelvic cavity without evidence of bowel or vascular injury. An 11 mm port was placed in the right lower quadrant under direct visualization. 5 mm port was placed in the left lower quadrant under direct visualization. The above-noted findings were photo documented. Extensive adhesiolysis was performed using the Ace Harmonic scalpel along with grasping forceps to remove the cul-de-sac adhesions as well as the adhesions encasing ovaries and tubes to the pelvic sidewall. The adhesiolysis and fimbrial lysis enabled a more normal pelvic anatomic orientation. The distal right fallopian tube was then removed using the Ace Harmonic scalpel cutting and coagulating along the mesosalpinx. The fallopian tube segment and products of conception were then removed through the 11 mm port with the Endo bag apparatus. Copious irrigation of the pelvis was performed; the irrigant fluid was aspirated. Inspection of the pelvis and upper abdomen revealed excellent hemostasis. Following completion of the procedure all instrument dictation was removed from the abdominal pelvic cavity. Pneumoperitoneum was released. The incisions were closed with a 0 Vicryl suture on the 11 mm port incision, followed by subcuticular stitches for skin closure.  [name] [name], MD, [facility]

## 2015-06-10 NOTE — Patient Instructions (Signed)
1. Laparoscopy with excision of tubal pregnancy is to be performed today 2. Return for postop check in 1 week

## 2015-06-10 NOTE — Anesthesia Procedure Notes (Signed)
Procedure Name: Intubation Date/Time: 06/10/2015 1:21 PM Performed by: Ginger CarneMICHELET, Janalee Grobe Pre-anesthesia Checklist: Patient identified, Emergency Drugs available, Suction available, Patient being monitored and Timeout performed Patient Re-evaluated:Patient Re-evaluated prior to inductionOxygen Delivery Method: Circle system utilized Preoxygenation: Pre-oxygenation with 100% oxygen Intubation Type: IV induction Ventilation: Mask ventilation without difficulty Laryngoscope Size: Miller and 2 Grade View: Grade I Tube type: Oral Tube size: 7.0 mm Number of attempts: 1 Airway Equipment and Method: Stylet Placement Confirmation: ETT inserted through vocal cords under direct vision,  positive ETCO2 and breath sounds checked- equal and bilateral Secured at: 20 cm Tube secured with: Tape Dental Injury: Teeth and Oropharynx as per pre-operative assessment

## 2015-06-10 NOTE — Progress Notes (Signed)
Subjective:  PREOPERATIVE HISTORY AND PHYSICAL     Date of surgery: 06/10/2015 Diagnoses: Suspected tubal pregnancy, left adnexa   Patient is a 30 y.o. [id number] scheduled forLaparoscopy with excision of suspected left tubal pregnancy. Patient is currently asymptomatic. Within the past week she had slight blood-tinged mucus discharge without active bleeding. She is not having lateralization pain. Serial quantitative hCG titers have demonstrated a suboptimal rise. Serial ultrasounds over the past 2 weeks did not demonstrate an intrauterine gestation and on today's ultrasound assessment, there is evidence of a newly identified left adnexal cystic mass with blood flow suspicious for tubal pregnancy.  Blood type is AB+ Current quantitative hCG titer is 5800. Patient is not a candidate for methotrexate therapy   Pertinent Gynecological History:  Discussed Blood/Blood Products: yes   Menstrual History: OB History    Gravida Para Term Preterm AB TAB SAB Ectopic Multiple Living   1    1  1          Menarche age: NA Patient's last menstrual period was 04/09/2015 (exact date).    Past Medical History  Diagnosis Date  . SAB (spontaneous abortion)     Past Surgical History  Procedure Laterality Date  . No past surgeries      OB History  Gravida Para Term Preterm AB SAB TAB Ectopic Multiple Living  1    1 1         # Outcome Date GA Lbr Len/2nd Weight Sex Delivery Anes PTL Lv  1 SAB 12/2014              Social History   Social History  . Marital Status: Single    Spouse Name: N/A  . Number of Children: N/A  . Years of Education: N/A   Social History Main Topics  . Smoking status: Never Smoker   . Smokeless tobacco: None  . Alcohol Use: No  . Drug Use: No  . Sexual Activity: Yes    Birth Control/ Protection: None   Other Topics Concern  . None   Social History Narrative    Family History  Problem Relation Age of Onset  . Asthma Maternal Grandmother   .  Colon cancer Maternal Grandmother   . Diabetes Maternal Grandmother   . Ovarian cancer Neg Hx      (Not in a hospital admission)  Allergies  Allergen Reactions  . Codeine Nausea And Vomiting    Review of Systems Constitutional: No recent fever/chills/sweats Respiratory: No recent cough/bronchitis Cardiovascular: No chest pain Gastrointestinal: No recent nausea/vomiting/diarrhea Genitourinary: No UTI symptoms Hematologic/lymphatic:No history of coagulopathy or recent blood thinner use    Objective:    BP 120/78 mmHg  Pulse 76  Ht 4' 11.5" (1.511 m)  Wt 179 lb (81.194 kg)  BMI 35.56 kg/m2  LMP 04/09/2015 (Exact Date)  General:   Normal  Skin:   normal  HEENT:  Normal  Neck:  Supple without Adenopathy or Thyromegaly  Lungs:   Heart:              Breasts:   Abdomen:  Pelvis:  M/S   Extremeties:  Neuro:    clear to auscultation bilaterally   Normal without murmur   Not Examined   soft, non-tender; bowel sounds normal; no masses,  no organomegaly   Exam deferred to OR  No CVAT  Warm/Dry   Normal          Assessment:    Suspected tubal pregnancy, left adnexa   Plan:  Laparoscopic excision  of tubal pregnancy  PREOPERATIVE counseling: Patient is counseled regarding the planned procedure and she is laparoscopy with probable excision of left tubal pregnancy. She is understanding of the planned procedure and is aware of and is accepting of all surgical risks which include but are not limited to bleeding, infection, pelvic organ injury with need for repair, blood clot disorders, anesthesia risks, etc. All questions have been answered. Informed consent is given. Patient is ready and willing to proceed with surgery as scheduled.  Herold Harms, MD  Note: This dictation was prepared with Dragon dictation along with smaller phrase technology. Any transcriptional errors that result from this process are unintentional.

## 2015-06-10 NOTE — Transfer of Care (Signed)
Immediate Anesthesia Transfer of Care Note  Patient: Barbara GipMaria Huff  Procedure(s) Performed: Procedure(s): DIAGNOSTIC LAPAROSCOPY WITH REMOVAL OF ECTOPIC PREGNANCY (Left) LAPAROSCOPIC LYSIS OF ADHESIONS (N/A)  Patient Location: PACU  Anesthesia Type:General  Level of Consciousness: sedated  Airway & Oxygen Therapy: Patient Spontanous Breathing and Patient connected to face mask oxygen  Post-op Assessment: Report given to RN and Post -op Vital signs reviewed and stable  Post vital signs: Reviewed and stable  Last Vitals:  Filed Vitals:   06/10/15 1238 06/10/15 1433  BP: 139/89 121/56  Pulse: 87   Temp: 36.7 C 36.4 C  Resp: 18     Last Pain:  Filed Vitals:   06/10/15 1434  PainSc: 0-No pain         Complications: No apparent anesthesia complications

## 2015-06-10 NOTE — H&P (Signed)
[address] she is Subjective:  PREOPERATIVE HISTORY AND PHYSICAL     Date of surgery: 06/10/2015 Diagnoses: Suspected tubal pregnancy, left adnexa   Patient is a 30 y.o. [id number] scheduled forLaparoscopy with excision of suspected left tubal pregnancy. Patient is currently asymptomatic. Within the past week she had slight blood-tinged mucus discharge without active bleeding. She is not having lateralization pain. Serial quantitative hCG titers have demonstrated a suboptimal rise. Serial ultrasounds over the past 2 weeks did not demonstrate an intrauterine gestation and on today's ultrasound assessment, there is evidence of a newly identified left adnexal cystic mass with blood flow suspicious for tubal pregnancy.  Blood type is AB+ Current quantitative hCG titer is 5800. Patient is not a candidate for methotrexate therapy   Pertinent Gynecological History:  Discussed Blood/Blood Products: yes   Menstrual History: OB History    Gravida Para Term Preterm AB TAB SAB Ectopic Multiple Living   1    1  1          Menarche age: NA Patient's last menstrual period was 04/09/2015 (exact date).    Past Medical History  Diagnosis Date  . SAB (spontaneous abortion)     Past Surgical History  Procedure Laterality Date  . No past surgeries      OB History  Gravida Para Term Preterm AB SAB TAB Ectopic Multiple Living  1    1 1         # Outcome Date GA Lbr Len/2nd Weight Sex Delivery Anes PTL Lv  1 SAB 12/2014              Social History   Social History  . Marital Status: Single    Spouse Name: N/A  . Number of Children: N/A  . Years of Education: N/A   Social History Main Topics  . Smoking status: Never Smoker   . Smokeless tobacco: None  . Alcohol Use: No  . Drug Use: No  . Sexual Activity: Yes    Birth Control/ Protection: None   Other Topics Concern  . None   Social History Narrative    Family History  Problem Relation Age of Onset  . Asthma  Maternal Grandmother   . Colon cancer Maternal Grandmother   . Diabetes Maternal Grandmother   . Ovarian cancer Neg Hx      (Not in a hospital admission)  Allergies  Allergen Reactions  . Codeine Nausea And Vomiting    Review of Systems Constitutional: No recent fever/chills/sweats Respiratory: No recent cough/bronchitis Cardiovascular: No chest pain Gastrointestinal: No recent nausea/vomiting/diarrhea Genitourinary: No UTI symptoms Hematologic/lymphatic:No history of coagulopathy or recent blood thinner use    Objective:    BP 120/78 mmHg  Pulse 76  Ht 4' 11.5" (1.511 m)  Wt 179 lb (81.194 kg)  BMI 35.56 kg/m2  LMP 04/09/2015 (Exact Date)  General:   Normal  Skin:   normal  HEENT:  Normal  Neck:  Supple without Adenopathy or Thyromegaly  Lungs:   Heart:              Breasts:   Abdomen:  Pelvis:  M/S   Extremeties:  Neuro:    clear to auscultation bilaterally   Normal without murmur   Not Examined   soft, non-tender; bowel sounds normal; no masses,  no organomegaly   Exam deferred to OR  No CVAT  Warm/Dry   Normal          Assessment:    Suspected tubal pregnancy, left adnexa  Plan:  Laparoscopic excision of tubal pregnancy  PREOPERATIVE counseling: Patient is counseled regarding the planned procedure and she is laparoscopy with probable excision of left tubal pregnancy. She is understanding of the planned procedure and is aware of and is accepting of all surgical risks which include but are not limited to bleeding, infection, pelvic organ injury with need for repair, blood clot disorders, anesthesia risks, etc. All questions have been answered. Informed consent is given. Patient is ready and willing to proceed with surgery as scheduled.  Herold Harms, MD  Note: This dictation was prepared with Dragon dictation along with smaller phrase technology. Any transcriptional errors that result from this process are unintentional.

## 2015-06-11 ENCOUNTER — Encounter: Payer: Self-pay | Admitting: Obstetrics and Gynecology

## 2015-06-11 NOTE — Anesthesia Postprocedure Evaluation (Addendum)
Anesthesia Post Note  Patient: Barbara GipMaria Huff  Procedure(s) Performed: Procedure(s) (LRB): DIAGNOSTIC LAPAROSCOPY WITH REMOVAL OF ECTOPIC PREGNANCY (Left) LAPAROSCOPIC LYSIS OF ADHESIONS (N/A)  Patient location during evaluation: PACU Anesthesia Type: General Level of consciousness: awake and alert Pain management: pain level controlled Vital Signs Assessment: post-procedure vital signs reviewed and stable Respiratory status: spontaneous breathing, nonlabored ventilation, respiratory function stable and patient connected to nasal cannula oxygen Cardiovascular status: blood pressure returned to baseline and stable Postop Assessment: no signs of nausea or vomiting Anesthetic complications: no    Last Vitals:  Filed Vitals:   06/10/15 1527 06/10/15 1541  BP:  108/68  Pulse:  77  Temp: 37 C 36.7 C  Resp:  16    Last Pain:  Filed Vitals:   06/10/15 1545  PainSc: 3                  Lenard SimmerAndrew Ole Lafon

## 2015-06-14 LAB — SURGICAL PATHOLOGY

## 2015-06-17 ENCOUNTER — Encounter: Payer: Self-pay | Admitting: Obstetrics and Gynecology

## 2015-06-17 ENCOUNTER — Ambulatory Visit (INDEPENDENT_AMBULATORY_CARE_PROVIDER_SITE_OTHER): Payer: Self-pay | Admitting: Obstetrics and Gynecology

## 2015-06-17 VITALS — BP 109/73 | HR 80 | Ht 59.5 in | Wt 179.4 lb

## 2015-06-17 DIAGNOSIS — N736 Female pelvic peritoneal adhesions (postinfective): Secondary | ICD-10-CM

## 2015-06-17 DIAGNOSIS — Z09 Encounter for follow-up examination after completed treatment for conditions other than malignant neoplasm: Secondary | ICD-10-CM

## 2015-06-17 DIAGNOSIS — O001 Tubal pregnancy without intrauterine pregnancy: Secondary | ICD-10-CM

## 2015-06-17 DIAGNOSIS — Z9889 Other specified postprocedural states: Secondary | ICD-10-CM | POA: Insufficient documentation

## 2015-06-17 DIAGNOSIS — O00109 Unspecified tubal pregnancy without intrauterine pregnancy: Secondary | ICD-10-CM

## 2015-06-17 NOTE — Progress Notes (Signed)
Chief complaint: 1. 1 week postop check 2. Status post laparoscopic excision of left tubal pregnancy, unruptured 3. Pelvic adhesive disease  Patient presents for 1 week postop check. She is doing well with normal bowel and bladder function. She is not experiencing any significant pelvic pain.  Findings of surgery were reviewed. Photodocumentation was reviewed.  Surgical pathology: DIAGNOSIS:  A. LEFT FALLOPIAN TUBE PORTION AND ECTOPIC PREGNANCY; LEFT DISTAL  PARTIAL SALPINGECTOMY:  - FALLOPIAN TUBE WITH LUMINAL AND MURAL IMMATURE DECIDUA AND CHORIONIC  VILLI, CONSISTENT WITH ECTOPIC GESTATION.   OBJECTIVE: BP 109/73 mmHg  Pulse 80  Ht 4' 11.5" (1.511 m)  Wt 179 lb 6.4 oz (81.375 kg)  BMI 35.64 kg/m2  LMP 04/09/2015 (Exact Date) Well-appearing white female in no acute distress Abdomen: Soft, nontender laparoscopy port sites are healing well without evidence of inflammation, induration, or drainage. Residual suture is intact.    ASSESSMENT: 1. Normal postop check 1 week status post laparoscopic excision of left tubal pregnancy, unruptured 2. Pelvic adhesive disease, status post laparoscopic adhesiolysis 3. Increased risk for ectopic recurrence-10% quoted.  PLAN: 1.  Resume activities as tolerated 2. Continue with prenatal vitamin or multivitamin with folic acid 3. Wait for 1 normal cycle before attempting to conceive 4. Patient understands the importance of early onset of prenatal care once conception occurs in order to verify intrauterine location  5. Return as needed   Herold HarmsMartin A Allah Reason, MD  Note: This dictation was prepared with Dragon dictation along with smaller phrase technology. Any transcriptional errors that result from this process are unintentional.

## 2015-06-17 NOTE — Patient Instructions (Signed)
1. Resume all activities as tolerated 2. Do not attempt to conceive until 1 normal cycle occurs 3. Recommend multivitamin or prenatal vitamins daily with 0.4 mg of folic acid

## 2015-06-24 ENCOUNTER — Emergency Department: Payer: Self-pay

## 2015-06-24 ENCOUNTER — Inpatient Hospital Stay
Admission: EM | Admit: 2015-06-24 | Discharge: 2015-06-27 | DRG: 872 | Disposition: A | Discharge status: Home / Self Care | Payer: Self-pay | Attending: Internal Medicine | Admitting: Internal Medicine

## 2015-06-24 ENCOUNTER — Encounter: Payer: Self-pay | Admitting: Emergency Medicine

## 2015-06-24 DIAGNOSIS — N10 Acute tubulo-interstitial nephritis: Secondary | ICD-10-CM | POA: Diagnosis present

## 2015-06-24 DIAGNOSIS — E876 Hypokalemia: Secondary | ICD-10-CM | POA: Diagnosis present

## 2015-06-24 DIAGNOSIS — A4151 Sepsis due to Escherichia coli [E. coli]: Principal | ICD-10-CM | POA: Diagnosis present

## 2015-06-24 DIAGNOSIS — Z825 Family history of asthma and other chronic lower respiratory diseases: Secondary | ICD-10-CM

## 2015-06-24 DIAGNOSIS — R112 Nausea with vomiting, unspecified: Secondary | ICD-10-CM

## 2015-06-24 DIAGNOSIS — N12 Tubulo-interstitial nephritis, not specified as acute or chronic: Secondary | ICD-10-CM

## 2015-06-24 DIAGNOSIS — E860 Dehydration: Secondary | ICD-10-CM

## 2015-06-24 DIAGNOSIS — D649 Anemia, unspecified: Secondary | ICD-10-CM | POA: Diagnosis present

## 2015-06-24 DIAGNOSIS — Z800 Family history of malignant neoplasm of digestive organs: Secondary | ICD-10-CM

## 2015-06-24 DIAGNOSIS — R000 Tachycardia, unspecified: Secondary | ICD-10-CM | POA: Diagnosis present

## 2015-06-24 DIAGNOSIS — Z9889 Other specified postprocedural states: Secondary | ICD-10-CM

## 2015-06-24 DIAGNOSIS — Z833 Family history of diabetes mellitus: Secondary | ICD-10-CM

## 2015-06-24 DIAGNOSIS — B9620 Unspecified Escherichia coli [E. coli] as the cause of diseases classified elsewhere: Secondary | ICD-10-CM | POA: Diagnosis present

## 2015-06-24 DIAGNOSIS — Z886 Allergy status to analgesic agent status: Secondary | ICD-10-CM

## 2015-06-24 HISTORY — DX: Acute pyelonephritis: N10

## 2015-06-24 LAB — COMPREHENSIVE METABOLIC PANEL
ALT: 19 U/L (ref 14–54)
AST: 20 U/L (ref 15–41)
Albumin: 3.6 g/dL (ref 3.5–5.0)
Alkaline Phosphatase: 63 U/L (ref 38–126)
Anion gap: 10 (ref 5–15)
BUN: 9 mg/dL (ref 6–20)
CHLORIDE: 103 mmol/L (ref 101–111)
CO2: 22 mmol/L (ref 22–32)
Calcium: 8.7 mg/dL — ABNORMAL LOW (ref 8.9–10.3)
Creatinine, Ser: 0.64 mg/dL (ref 0.44–1.00)
Glucose, Bld: 165 mg/dL — ABNORMAL HIGH (ref 65–99)
POTASSIUM: 3.1 mmol/L — AB (ref 3.5–5.1)
SODIUM: 135 mmol/L (ref 135–145)
Total Bilirubin: 1.9 mg/dL — ABNORMAL HIGH (ref 0.3–1.2)
Total Protein: 7.5 g/dL (ref 6.5–8.1)

## 2015-06-24 LAB — URINALYSIS COMPLETE WITH MICROSCOPIC (ARMC ONLY)
Bilirubin Urine: NEGATIVE
Glucose, UA: NEGATIVE mg/dL
HGB URINE DIPSTICK: NEGATIVE
Nitrite: NEGATIVE
PH: 6 (ref 5.0–8.0)
Protein, ur: 30 mg/dL — AB
Specific Gravity, Urine: 1.011 (ref 1.005–1.030)

## 2015-06-24 LAB — BLOOD GAS, VENOUS
ACID-BASE DEFICIT: 2.7 mmol/L — AB (ref 0.0–2.0)
BICARBONATE: 22.6 meq/L (ref 21.0–28.0)
O2 Saturation: 64.4 %
PCO2 VEN: 40 mmHg — AB (ref 44.0–60.0)
PH VEN: 7.36 (ref 7.320–7.430)
PO2 VEN: 35 mmHg (ref 31.0–45.0)
Patient temperature: 37

## 2015-06-24 LAB — CBC
HEMATOCRIT: 35.1 % (ref 35.0–47.0)
Hemoglobin: 11.9 g/dL — ABNORMAL LOW (ref 12.0–16.0)
MCH: 28.3 pg (ref 26.0–34.0)
MCHC: 33.8 g/dL (ref 32.0–36.0)
MCV: 83.7 fL (ref 80.0–100.0)
Platelets: 256 10*3/uL (ref 150–440)
RBC: 4.19 MIL/uL (ref 3.80–5.20)
RDW: 13.6 % (ref 11.5–14.5)
WBC: 13 10*3/uL — AB (ref 3.6–11.0)

## 2015-06-24 LAB — LACTIC ACID, PLASMA: Lactic Acid, Venous: 0.6 mmol/L (ref 0.5–2.0)

## 2015-06-24 LAB — POC URINE PREG, ED: Preg Test, Ur: NEGATIVE

## 2015-06-24 LAB — LIPASE, BLOOD: LIPASE: 18 U/L (ref 11–51)

## 2015-06-24 MED ORDER — ENOXAPARIN SODIUM 40 MG/0.4ML ~~LOC~~ SOLN
40.0000 mg | SUBCUTANEOUS | Status: DC
  Filled 2015-06-24 (×2): qty 0.4

## 2015-06-24 MED ORDER — SODIUM CHLORIDE 0.9 % IV SOLN
INTRAVENOUS | Status: DC
  Administered 2015-06-24 – 2015-06-27 (×6): via INTRAVENOUS

## 2015-06-24 MED ORDER — OXYCODONE HCL 5 MG PO TABS
5.0000 mg | ORAL_TABLET | ORAL | Status: DC | PRN
  Administered 2015-06-25 – 2015-06-27 (×6): 5 mg via ORAL
  Filled 2015-06-24 (×7): qty 1

## 2015-06-24 MED ORDER — IOPAMIDOL (ISOVUE-300) INJECTION 61%
100.0000 mL | Freq: Once | INTRAVENOUS | Status: AC | PRN
  Administered 2015-06-24: 100 mL via INTRAVENOUS

## 2015-06-24 MED ORDER — ONDANSETRON HCL 4 MG/2ML IJ SOLN
4.0000 mg | Freq: Once | INTRAMUSCULAR | Status: AC
  Administered 2015-06-24: 4 mg via INTRAVENOUS

## 2015-06-24 MED ORDER — ACETAMINOPHEN 325 MG PO TABS
650.0000 mg | ORAL_TABLET | Freq: Four times a day (QID) | ORAL | Status: DC | PRN
  Administered 2015-06-24 – 2015-06-27 (×8): 650 mg via ORAL
  Filled 2015-06-24 (×8): qty 2

## 2015-06-24 MED ORDER — ACETAMINOPHEN 650 MG RE SUPP: 650.0000 mg | Freq: Four times a day (QID) | RECTAL | Status: DC | PRN

## 2015-06-24 MED ORDER — DIATRIZOATE MEGLUMINE & SODIUM 66-10 % PO SOLN
15.0000 mL | Freq: Once | ORAL | Status: AC
  Administered 2015-06-24: 15 mL via ORAL

## 2015-06-24 MED ORDER — POTASSIUM CHLORIDE 10 MEQ/100ML IV SOLN
10.0000 meq | INTRAVENOUS | Status: DC
  Administered 2015-06-24 (×2): 10 meq via INTRAVENOUS
  Filled 2015-06-24 (×6): qty 100

## 2015-06-24 MED ORDER — MORPHINE SULFATE (PF) 2 MG/ML IV SOLN
2.0000 mg | INTRAVENOUS | Status: DC | PRN
  Administered 2015-06-27: 09:00:00 2 mg via INTRAVENOUS
  Filled 2015-06-24: qty 1

## 2015-06-24 MED ORDER — POTASSIUM CHLORIDE CRYS ER 20 MEQ PO TBCR
20.0000 meq | EXTENDED_RELEASE_TABLET | Freq: Once | ORAL | Status: AC
  Administered 2015-06-24: 20 meq via ORAL
  Filled 2015-06-24: qty 1

## 2015-06-24 MED ORDER — ONDANSETRON HCL 4 MG/2ML IJ SOLN
4.0000 mg | Freq: Four times a day (QID) | INTRAMUSCULAR | Status: DC | PRN
  Administered 2015-06-25: 4 mg via INTRAVENOUS
  Filled 2015-06-24: qty 2

## 2015-06-24 MED ORDER — IBUPROFEN 400 MG PO TABS
ORAL_TABLET | ORAL | Status: AC
  Administered 2015-06-24: 800 mg via ORAL
  Filled 2015-06-24: qty 2

## 2015-06-24 MED ORDER — IBUPROFEN 800 MG PO TABS
800.0000 mg | ORAL_TABLET | Freq: Once | ORAL | Status: AC
  Administered 2015-06-24: 800 mg via ORAL

## 2015-06-24 MED ORDER — ONDANSETRON HCL 4 MG/2ML IJ SOLN
4.0000 mg | Freq: Once | INTRAMUSCULAR | Status: AC
Start: 2015-06-24 — End: 2015-06-24
  Administered 2015-06-24: 4 mg via INTRAVENOUS
  Filled 2015-06-24: qty 2

## 2015-06-24 MED ORDER — DEXTROSE 5 % IV SOLN
1.0000 g | INTRAVENOUS | Status: DC
  Filled 2015-06-24: qty 10

## 2015-06-24 MED ORDER — ACETAMINOPHEN 325 MG PO TABS
650.0000 mg | ORAL_TABLET | Freq: Once | ORAL | Status: AC
  Administered 2015-06-24: 650 mg via ORAL
  Filled 2015-06-24: qty 2

## 2015-06-24 MED ORDER — MORPHINE SULFATE (PF) 4 MG/ML IV SOLN
4.0000 mg | Freq: Once | INTRAVENOUS | Status: AC
  Administered 2015-06-24: 4 mg via INTRAVENOUS
  Filled 2015-06-24: qty 1

## 2015-06-24 MED ORDER — DOCUSATE SODIUM 100 MG PO CAPS
100.0000 mg | ORAL_CAPSULE | Freq: Two times a day (BID) | ORAL | Status: DC
Start: 2015-06-24 — End: 2015-06-27
  Administered 2015-06-24 – 2015-06-27 (×4): 100 mg via ORAL
  Filled 2015-06-24 (×4): qty 1

## 2015-06-24 MED ORDER — CEFTRIAXONE SODIUM 1 G IJ SOLR
1.0000 g | Freq: Once | INTRAMUSCULAR | Status: AC
  Administered 2015-06-24: 1 g via INTRAVENOUS
  Filled 2015-06-24: qty 10

## 2015-06-24 MED ORDER — SODIUM CHLORIDE 0.9 % IV SOLN
Freq: Once | INTRAVENOUS | Status: AC
  Administered 2015-06-24: 17:00:00 via INTRAVENOUS

## 2015-06-24 MED ORDER — ONDANSETRON HCL 4 MG/2ML IJ SOLN
INTRAMUSCULAR | Status: AC
  Administered 2015-06-24: 4 mg via INTRAVENOUS
  Filled 2015-06-24: qty 2

## 2015-06-24 MED ORDER — ONDANSETRON HCL 4 MG PO TABS: 4.0000 mg | ORAL_TABLET | Freq: Four times a day (QID) | ORAL | Status: DC | PRN

## 2015-06-24 NOTE — ED Notes (Signed)
Pt to ed with c/o headache, back pain, n/v x several days.

## 2015-06-24 NOTE — Progress Notes (Addendum)
Pharmacy Antibiotic Note  Barbara Huff is a 30 y.o. female admitted on 06/24/2015 with UTI.  Pharmacy has been consulted for ceftriaxone dosing.  Plan: Patient received ceftriaxone 1 g dose in ED Continue ceftriaxone 1 g IV daily   Height: 4\' 11"  (149.9 cm) Weight: 179 lb (81.194 kg) IBW/kg (Calculated) : 43.2  Temp (24hrs), Avg:101.3 F (38.5 C), Min:101 F (38.3 C), Max:101.8 F (38.8 C)   Recent Labs Lab 06/24/15 1330 06/24/15 1503  WBC 13.0*  --   CREATININE 0.64  --   LATICACIDVEN  --  0.6    Estimated Creatinine Clearance: 96.5 mL/min (by C-G formula based on Cr of 0.64).    Allergies  Allergen Reactions  . Codeine Nausea And Vomiting   Antimicrobials this admission: ceftriaxone 5/25 >>   Dose adjustments this admission:  Microbiology results: 5/25 BCx: Sent 5/25 UCx: Sent   Thank you for allowing pharmacy to be a part of this patient's care.  Barbara Huff, PharmD Clinical Pharmacist 06/24/2015 8:46 PM   [phone] [id number] lab reports BCID E. Coli in anaerobic bottle of 1 set. Spoke with Dr. Tobi BastosPyreddy - okay to escalate to meropenem. Barbara Huff, Pharm.D., BCPS

## 2015-06-24 NOTE — H&P (Signed)
[facility] - [place] at [facility]   PATIENT NAME: Barbara Huff    MR#:  [id number]  DATE OF BIRTH:  1985-08-25  DATE OF ADMISSION:  06/24/2015  PRIMARY CARE PHYSICIAN: No PCP Per Patient   REQUESTING/REFERRING PHYSICIAN: Dr. Suella Broad  CHIEF COMPLAINT:   Chief Complaint  Patient presents with  . Back Pain  . Headache  . Emesis    HISTORY OF PRESENT ILLNESS:  Barbara Huff  is a 30 y.o. female with Recent ectopic pregnancy 2 weeks ago for which she had Laparoscopic removal of left sided ectopic pregnancy comes from home secondary to fevers and chills and nausea vomiting for 2 days now. She also was complaining of low back pain worse on the left side. Denies any dysuria or increased frequency of urination. Last urine analysis 2 weeks ago was normal. CT of the abdomen did not reveal any abscess in the reproductive organs but does show possible pyelonephritis. Patient had a temperature of 101.28F with increased white count. She was given fluids and given a dose of Rocephin and was being discharged home on oral antibiotics. However she became nauseous and started throwing up and fever spiked again and became tachycardic with heart rate in the 130s. The patient is being admitted for sepsis now.  PAST MEDICAL HISTORY:   Past Medical History  Diagnosis Date  . SAB (spontaneous abortion)     PAST SURGICAL HISTORY:   Past Surgical History  Procedure Laterality Date  . Diagnostic laparoscopy with removal of ectopic pregnancy Left 06/10/2015    Procedure: DIAGNOSTIC LAPAROSCOPY WITH REMOVAL OF ECTOPIC PREGNANCY;  Surgeon: Herold Harms, MD;  Location: [facility] ORS;  Service: Gynecology;  Laterality: Left;  . Laparoscopic lysis of adhesions N/A 06/10/2015    Procedure: LAPAROSCOPIC LYSIS OF ADHESIONS;  Surgeon: Herold Harms, MD;  Location: [facility] ORS;  Service: Gynecology;  Laterality: N/A;    SOCIAL HISTORY:   Social History  Substance Use Topics  .  Smoking status: Never Smoker   . Smokeless tobacco: Not on file  . Alcohol Use: No    FAMILY HISTORY:   Family History  Problem Relation Age of Onset  . Asthma Maternal Grandmother   . Colon cancer Maternal Grandmother   . Diabetes Maternal Grandmother   . Ovarian cancer Neg Hx     DRUG ALLERGIES:   Allergies  Allergen Reactions  . Codeine Nausea And Vomiting    REVIEW OF SYSTEMS:   Review of Systems  Constitutional: Positive for chills and malaise/fatigue. Negative for fever and weight loss.  HENT: Negative for ear discharge, ear pain, hearing loss and nosebleeds.   Eyes: Negative for blurred vision, double vision and photophobia.  Respiratory: Negative for cough, hemoptysis, shortness of breath and wheezing.   Cardiovascular: Negative for chest pain, palpitations, orthopnea and leg swelling.  Gastrointestinal: Positive for nausea and vomiting. Negative for heartburn, abdominal pain, diarrhea, constipation and melena.  Genitourinary: Negative for dysuria, urgency and frequency.  Musculoskeletal: Positive for myalgias and back pain. Negative for neck pain.  Skin: Negative for rash.  Neurological: Negative for dizziness, tingling, sensory change, speech change, focal weakness and headaches.  Endo/Heme/Allergies: Does not bruise/bleed easily.  Psychiatric/Behavioral: Negative for depression.    MEDICATIONS AT HOME:   Prior to Admission medications   Medication Sig Start Date End Date Taking? Authorizing Provider  ibuprofen (ADVIL,MOTRIN) 800 MG tablet Take 1 tablet (800 mg total) by mouth 3 (three) times daily. 06/10/15  Yes Herold Harms, MD  VITAL SIGNS:  Blood pressure 99/80, pulse 134, temperature 101.2 F (38.4 C), temperature source Oral, resp. rate 31, height 4\' 11"  (1.499 m), weight 81.194 kg (179 lb), last menstrual period 04/09/2015, SpO2 96 %.  PHYSICAL EXAMINATION:   Physical Exam  GENERAL:  30 y.o.-year-old patient sitting in the bed with no  acute distress.  EYES: Pupils equal, round, reactive to light and accommodation. No scleral icterus. Extraocular muscles intact.  HEENT: Head atraumatic, normocephalic. Oropharynx and nasopharynx clear.  NECK:  Supple, no jugular venous distention. No thyroid enlargement, no tenderness.  LUNGS: Normal breath sounds bilaterally, no wheezing, rales,rhonchi or crepitation. No use of accessory muscles of respiration.  CARDIOVASCULAR: S1, S2 normal. No murmurs, rubs, or gallops.  ABDOMEN: Soft, nontender, nondistended. Bowel sounds present. No organomegaly or mass. Healed recent laparoscopic scars EXTREMITIES: No pedal edema, cyanosis, or clubbing.  NEUROLOGIC: Cranial nerves II through XII are intact. Muscle strength 5/5 in all extremities. Sensation intact. Gait not checked.  PSYCHIATRIC: The patient is alert and oriented x 3.  SKIN: No obvious rash, lesion, or ulcer.   LABORATORY PANEL:   CBC  Recent Labs Lab 06/24/15 1330  WBC 13.0*  HGB 11.9*  HCT 35.1  PLT 256   ------------------------------------------------------------------------------------------------------------------  Chemistries   Recent Labs Lab 06/24/15 1330  NA 135  K 3.1*  CL 103  CO2 22  GLUCOSE 165*  BUN 9  CREATININE 0.64  CALCIUM 8.7*  AST 20  ALT 19  ALKPHOS 63  BILITOT 1.9*   ------------------------------------------------------------------------------------------------------------------  Cardiac Enzymes No results for input(s): TROPONINI in the last 168 hours. ------------------------------------------------------------------------------------------------------------------  RADIOLOGY:  Ct Abdomen Pelvis W Contrast  06/24/2015  CLINICAL DATA:  Abdominal and flank pain (side not specified), back pain, nausea, and vomiting for several days EXAM: CT ABDOMEN AND PELVIS WITH CONTRAST TECHNIQUE: Multidetector CT imaging of the abdomen and pelvis was performed using the standard protocol following  bolus administration of intravenous contrast. Sagittal and coronal MPR images reconstructed from axial data set. CONTRAST:  100mL ISOVUE-300 IOPAMIDOL (ISOVUE-300) INJECTION 61% IV. Dilute oral contrast. COMPARISON:  None FINDINGS: Lower chest:  Lung bases clear Hepatobiliary: Liver and gallbladder normal appearance. No biliary dilatation. Pancreas: Normal appearance Spleen: Normal appearance Adrenals/Urinary Tract: Normal appearing adrenal glands and kidneys. No urinary tract calcification or dilatation. Mild enhancement of the walls of the renal pelves and ureters is seen, nonspecific but could reflect urinary tract infection. Bladder unremarkable. Stomach/Bowel: Normal appendix. Stomach and bowel loops normal appearance. Vascular/Lymphatic: Vascular structures patent.  No adenopathy. Reproductive: Normal appearing uterus and adnexa Other: Small amount of nonspecific free pelvic fluid. No mass or free air. No hernia. Musculoskeletal: Normal appearance IMPRESSION: Mild enhancement of the walls of the renal pelves and ureters bilaterally, nonspecific but could reflect urinary tract infection; recommend correlation with urinalysis. No evidence of urinary tract obstruction or calcification. Remainder of exam unremarkable. Electronically Signed   By: Ulyses SouthwardMark  Boles M.D.   On: 06/24/2015 15:31    EKG:   Orders placed or performed in visit on 12/30/07  . EKG 12-Lead    IMPRESSION AND PLAN:   Barbara Huff  is a 30 y.o. female with Recent ectopic pregnancy 2 weeks ago for which she had Laparoscopic removal of left sided ectopic pregnancy comes from home secondary to fevers and chills and nausea vomiting for 2 days now.  #1 sepsis-secondary to acute pyelonephritis. -Follow up urine and blood cultures. -Continue Rocephin. -CT of the abdomen with no other infection or abscess noted. Especially since  she had ectopic pregnancy removed 2 weeks ago. -IV fluids and pain medications. IV nausea medication.  #2 DVT  prophylaxis-Lovenox   All the records are reviewed and case discussed with ED provider. Management plans discussed with the patient, family and they are in agreement.  CODE STATUS: Full code  TOTAL TIME TAKING CARE OF THIS PATIENT: 50 minutes.    Enid Baas M.D on 06/24/2015 at 7:20 PM  Between 7am to 6pm - Pager - [phone]  After 6pm go to [email or web address] - password [email or web address] [facility]  [facility]  Office  [phone]  CC: Primary care physician; No PCP Per Patient

## 2015-06-24 NOTE — ED Provider Notes (Signed)
[facility] Emergency Department Provider Note   ____________________________________________  Time seen: Approximately 1:51 PM  I have reviewed the triage vital signs and the nursing notes.   HISTORY  Chief Complaint Back Pain; Headache; and Emesis    HPI Barbara Huff is a 30 y.o. female patient complains of headache vomiting and low back pain on the left side. This came on in the last few days. She has recently had a salpingostomy for tubal pregnancy. Pain came on several days after that. The pain is severe achy in the left flank made worse by movement deep breathing and bumps in the road. She also has a fever.  Past Medical History  Diagnosis Date  . SAB (spontaneous abortion)     Patient Active Problem List   Diagnosis Date Noted  . Status post laparoscopy 06/17/2015  . Unruptured tubal pregnancy 06/10/2015    Past Surgical History  Procedure Laterality Date  . No past surgeries    . Diagnostic laparoscopy with removal of ectopic pregnancy Left 06/10/2015    Procedure: DIAGNOSTIC LAPAROSCOPY WITH REMOVAL OF ECTOPIC PREGNANCY;  Surgeon: Herold HarmsMartin A Defrancesco, MD;  Location: [facility] ORS;  Service: Gynecology;  Laterality: Left;  . Laparoscopic lysis of adhesions N/A 06/10/2015    Procedure: LAPAROSCOPIC LYSIS OF ADHESIONS;  Surgeon: Herold HarmsMartin A Defrancesco, MD;  Location: [facility] ORS;  Service: Gynecology;  Laterality: N/A;    Current Outpatient Rx  Name  Route  Sig  Dispense  Refill  . ibuprofen (ADVIL,MOTRIN) 800 MG tablet   Oral   Take 1 tablet (800 mg total) by mouth 3 (three) times daily.   50 tablet   1     Allergies Codeine  Family History  Problem Relation Age of Onset  . Asthma Maternal Grandmother   . Colon cancer Maternal Grandmother   . Diabetes Maternal Grandmother   . Ovarian cancer Neg Hx     Social History Social History  Substance Use Topics  . Smoking status: Never Smoker   . Smokeless tobacco: None  . Alcohol Use: No      Review of Systems Constitutional:fever/chills Eyes: No visual changes. ENT: No sore throat. Cardiovascular: Denies chest pain. Respiratory: Denies shortness of breath. Gastrointestinal: No abdominal pain.  No nausea, no vomiting.  No diarrhea.  No constipation. Genitourinary: Negative for dysuria. Musculoskeletal: Negative for back pain. Skin: Negative for rash. Neurological: Negative for headaches, focal weakness or numbness.  10-point ROS otherwise negative.  ____________________________________________   PHYSICAL EXAM:  VITAL SIGNS: ED Triage Vitals  Enc Vitals Group     BP 06/24/15 1326 110/88 mmHg     Pulse Rate 06/24/15 1326 132     Resp 06/24/15 1326 20     Temp 06/24/15 1326 101.8 F (38.8 C)     Temp Source 06/24/15 1326 Oral     SpO2 06/24/15 1326 97 %     Weight 06/24/15 1326 179 lb (81.194 kg)     Height 06/24/15 1326 4\' 11"  (1.499 m)     Head Cir --      Peak Flow --      Pain Score 06/24/15 1327 10     Pain Loc --      Pain Edu? --      Excl. in GC? --     Constitutional: Alert and oriented. In some pain. Eyes: Conjunctivae are normal. PERRL. EOMI. Head: Atraumatic. Nose: No congestion/rhinnorhea. Mouth/Throat: Mucous membranes are moist.  Oropharynx non-erythematous. Neck: No stridor.  Cardiovascular: Normal rate,  regular rhythm. Grossly normal heart sounds.  Good peripheral circulation. Respiratory: Normal respiratory effort.  No retractions. Lungs CTAB. Gastrointestinal: Soft and nontender. No distention. No abdominal bruits. No CVA tenderness but she is tender on the left side below the CVA area.. Musculoskeletal: No lower extremity tenderness nor edema.  No joint effusions. Neurologic:  Normal speech and language. No gross focal neurologic deficits are appreciated. No gait instability. Skin:  Skin is warm, dry and intact. No rash noted. Psychiatric: Mood and affect are normal. Speech and behavior are  normal.  ____________________________________________   LABS (all labs ordered are listed, but only abnormal results are displayed)  Labs Reviewed  COMPREHENSIVE METABOLIC PANEL - Abnormal; Notable for the following:    Potassium 3.1 (*)    Glucose, Bld 165 (*)    Calcium 8.7 (*)    Total Bilirubin 1.9 (*)    All other components within normal limits  CBC - Abnormal; Notable for the following:    WBC 13.0 (*)    Hemoglobin 11.9 (*)    All other components within normal limits  URINALYSIS COMPLETEWITH MICROSCOPIC ([facility] ONLY) - Abnormal; Notable for the following:    Color, Urine YELLOW (*)    APPearance CLOUDY (*)    Ketones, ur 2+ (*)    Protein, ur 30 (*)    Leukocytes, UA 3+ (*)    Bacteria, UA MANY (*)    Squamous Epithelial / LPF 6-30 (*)    All other components within normal limits  URINE CULTURE  CULTURE, BLOOD (ROUTINE X 2)  CULTURE, BLOOD (ROUTINE X 2)  LIPASE, BLOOD  LACTIC ACID, PLASMA  LACTIC ACID, PLASMA  CBC WITH DIFFERENTIAL/PLATELET  POC URINE PREG, ED   ____________________________________________  EKG   ____________________________________________  RADIOLOGY  CT shows only apparent pyelonephritis per radiology ____________________________________________   PROCEDURES    ____________________________________________   INITIAL IMPRESSION / ASSESSMENT AND PLAN / ED COURSE  Pertinent labs & imaging results that were available during my care of the patient were reviewed by me and considered in my medical decision making (see chart for details).  Discussed with Dr. Greggory Keen we will try to discharge the patient after some IV antibiotics. Dr. Ladona Ridgel will finish the evaluation of this patient. ____________________________________________   FINAL CLINICAL IMPRESSION(S) / ED DIAGNOSES  Final diagnoses:  None      NEW MEDICATIONS STARTED DURING THIS VISIT:  New Prescriptions   No medications on file     Note:  This document was  prepared using Dragon voice recognition software and may include unintentional dictation errors.    Arnaldo Natal, MD 06/24/15 [id number]

## 2015-06-24 NOTE — ED Notes (Signed)
Patient transported to CT 

## 2015-06-24 NOTE — ED Provider Notes (Addendum)
Progress note  6:57 PM 06/24/2015  After patient was given IV fluids and IV antibiotics as well as Tylenol, the plan was to send the patient home to be treated for pyelonephritis. We decided to observe her for 1-2 hours to make sure she responded to the therapy. Patient got extremely nauseous and had worsening tachycardia as well as her fever recurred. Patient vomited a couple of times in the emergency department. It was decided at that point that patient would be admitted to treat her pyelonephritis. The hospitalist with consult for admission. Patient was given additional IV fluids, Motrin for the fever, and Zofran IV for her vomiting. Patient's potassium was also 3.1 and the hospitalist is going to write orders for IV fluids to include potassium replacement. Patient will also have a venous blood gas drawn to rule out sepsis.  Leona CarryLinda M Sadrac Zeoli, MD 06/24/15 1858  Leona CarryLinda M Sinia Antosh, MD 06/24/15 1904  Leona CarryLinda M Quaran Kedzierski, MD 06/24/15 1906  Leona CarryLinda M Delonte Musich, MD 06/24/15 [phone]

## 2015-06-25 LAB — BASIC METABOLIC PANEL
ANION GAP: 6 (ref 5–15)
BUN: 7 mg/dL (ref 6–20)
CALCIUM: 7.9 mg/dL — AB (ref 8.9–10.3)
CO2: 24 mmol/L (ref 22–32)
Chloride: 109 mmol/L (ref 101–111)
Creatinine, Ser: 0.59 mg/dL (ref 0.44–1.00)
GFR calc Af Amer: >60 mL/min (ref >60)
GLUCOSE: 103 mg/dL — AB (ref 65–99)
POTASSIUM: 3.4 mmol/L — AB (ref 3.5–5.1)
SODIUM: 139 mmol/L (ref 135–145)

## 2015-06-25 LAB — BLOOD CULTURE ID PANEL (REFLEXED)
ACINETOBACTER BAUMANNII: NOT DETECTED
CANDIDA GLABRATA: NOT DETECTED
CANDIDA TROPICALIS: NOT DETECTED
Candida albicans: NOT DETECTED
Candida krusei: NOT DETECTED
Candida parapsilosis: NOT DETECTED
Carbapenem resistance: NOT DETECTED
ENTEROBACTER CLOACAE COMPLEX: NOT DETECTED
ENTEROCOCCUS SPECIES: NOT DETECTED
ESCHERICHIA COLI: DETECTED — AB
Enterobacteriaceae species: DETECTED — AB
HAEMOPHILUS INFLUENZAE: NOT DETECTED
Klebsiella oxytoca: NOT DETECTED
Klebsiella pneumoniae: NOT DETECTED
LISTERIA MONOCYTOGENES: NOT DETECTED
METHICILLIN RESISTANCE: NOT DETECTED
NEISSERIA MENINGITIDIS: NOT DETECTED
PROTEUS SPECIES: NOT DETECTED
Pseudomonas aeruginosa: NOT DETECTED
SERRATIA MARCESCENS: NOT DETECTED
STAPHYLOCOCCUS AUREUS BCID: NOT DETECTED
STAPHYLOCOCCUS SPECIES: NOT DETECTED
STREPTOCOCCUS PYOGENES: NOT DETECTED
STREPTOCOCCUS SPECIES: NOT DETECTED
Streptococcus agalactiae: NOT DETECTED
Streptococcus pneumoniae: NOT DETECTED
VANCOMYCIN RESISTANCE: NOT DETECTED

## 2015-06-25 LAB — CBC
HEMATOCRIT: 30.6 % — AB (ref 35.0–47.0)
HEMOGLOBIN: 10.4 g/dL — AB (ref 12.0–16.0)
MCH: 28.2 pg (ref 26.0–34.0)
MCHC: 34 g/dL (ref 32.0–36.0)
MCV: 82.9 fL (ref 80.0–100.0)
Platelets: 225 10*3/uL (ref 150–440)
RBC: 3.69 MIL/uL — ABNORMAL LOW (ref 3.80–5.20)
RDW: 13.7 % (ref 11.5–14.5)
WBC: 14.8 10*3/uL — AB (ref 3.6–11.0)

## 2015-06-25 MED ORDER — SODIUM CHLORIDE 0.9 % IV SOLN
2.0000 g | Freq: Three times a day (TID) | INTRAVENOUS | Status: DC
  Administered 2015-06-25: 06:00:00 2 g via INTRAVENOUS
  Filled 2015-06-25 (×3): qty 2

## 2015-06-25 MED ORDER — POTASSIUM CHLORIDE CRYS ER 20 MEQ PO TBCR
40.0000 meq | EXTENDED_RELEASE_TABLET | Freq: Once | ORAL | Status: AC
  Administered 2015-06-25: 40 meq via ORAL
  Filled 2015-06-25: qty 2

## 2015-06-25 MED ORDER — SODIUM CHLORIDE 0.9 % IV SOLN
1.0000 g | Freq: Three times a day (TID) | INTRAVENOUS | Status: DC
  Administered 2015-06-25 – 2015-06-26 (×3): 1 g via INTRAVENOUS
  Filled 2015-06-25 (×5): qty 1

## 2015-06-25 NOTE — Care Management (Signed)
Admitted to this facility with the diagnosis of pyelonephritis. Husband is [name]. Mother is [name]. [place] [employer]. States she has no insurance, but did receive financial counseling information in the emergency room. Last seen Dr. Greggory Keenefrancesco 06/17/15 for ruptured ectopic pregnancy, States that Dr, Greggory Keenefrancesco would probably see her for a follow-up appointment following this hospitalization [name]

## 2015-06-25 NOTE — Progress Notes (Signed)
Patient ID: Barbara Huff, female   DOB: [birth year], 30 y.o.   MRN: [id number] [facility] PROGRESS NOTE  Barbara Huff NWG:[id number] DOB: [birth year] DOA: 06/24/2015 PCP: No PCP Per Patient  HPI/Subjective: Patient still feeling wiped out. No burning on urination. No abdominal pain. Shaking chills have resolved.  Objective: Filed Vitals:   06/25/15 1221 06/25/15 1525  BP:  123/67  Pulse:  88  Temp: 99.2 F (37.3 C) 98.7 F (37.1 C)  Resp:  18    Filed Weights   06/24/15 1326  Weight: 81.194 kg (179 lb)    ROS: Review of Systems  Constitutional: Negative for fever and chills.  Eyes: Negative for blurred vision.  Respiratory: Negative for cough and shortness of breath.   Cardiovascular: Negative for chest pain.  Gastrointestinal: Negative for nausea, vomiting, abdominal pain, diarrhea and constipation.  Genitourinary: Negative for dysuria.  Musculoskeletal: Negative for joint pain.  Neurological: Negative for dizziness and headaches.   Exam: Physical Exam  Constitutional: She is oriented to person, place, and time.  HENT:  Nose: No mucosal edema.  Mouth/Throat: No oropharyngeal exudate or posterior oropharyngeal edema.  Eyes: Conjunctivae, EOM and lids are normal. Pupils are equal, round, and reactive to light.  Neck: No JVD present. Carotid bruit is not present. No edema present. No thyroid mass and no thyromegaly present.  Cardiovascular: S1 normal and S2 normal.  Tachycardia present.  Exam reveals no gallop.   No murmur heard. Pulses:      Dorsalis pedis pulses are 2+ on the right side, and 2+ on the left side.  Respiratory: No respiratory distress. She has no wheezes. She has no rhonchi. She has no rales.  GI: Soft. Bowel sounds are normal. There is no tenderness.  Musculoskeletal:       Right ankle: She exhibits no swelling.       Left ankle: She exhibits no swelling.  Lymphadenopathy:    She has no cervical adenopathy.  Neurological: She is alert and oriented  to person, place, and time. No cranial nerve deficit.  Skin: Skin is warm. No rash noted. Nails show no clubbing.  Psychiatric: She has a normal mood and affect.      Data Reviewed: Basic Metabolic Panel:  Recent Labs Lab 06/24/15 1330 06/25/15 0237  NA 135 139  K 3.1* 3.4*  CL 103 109  CO2 22 24  GLUCOSE 165* 103*  BUN 9 7  CREATININE 0.64 0.59  CALCIUM 8.7* 7.9*   Liver Function Tests:  Recent Labs Lab 06/24/15 1330  AST 20  ALT 19  ALKPHOS 63  BILITOT 1.9*  PROT 7.5  ALBUMIN 3.6    Recent Labs Lab 06/24/15 1330  LIPASE 18   CBC:  Recent Labs Lab 06/24/15 1330 06/25/15 0237  WBC 13.0* 14.8*  HGB 11.9* 10.4*  HCT 35.1 30.6*  MCV 83.7 82.9  PLT 256 225     Recent Results (from the past 240 hour(s))  Culture, blood (routine x 2)     Status: None (Preliminary result)   Collection Time: 06/24/15  3:03 PM  Result Value Ref Range Status   Specimen Description BLOOD RT ARM  Final   Special Requests BOTTLES DRAWN AEROBIC AND ANAEROBIC 10CC  Final   Culture  Setup Time   Final    GRAM NEGATIVE RODS IN BOTH AEROBIC AND ANAEROBIC BOTTLES CRITICAL RESULT CALLED TO, READ BACK BY AND VERIFIED WITH: NATE COOKSON ON 06/25/15 AT 0530 BY TLB CONFIREMD BY TLB/CAF Organism ID to follow  Culture   Final    GRAM NEGATIVE RODS IN BOTH AEROBIC AND ANAEROBIC BOTTLES IDENTIFICATION TO FOLLOW    Report Status PENDING  Incomplete  Blood Culture ID Panel (Reflexed)     Status: Abnormal   Collection Time: 06/24/15  3:03 PM  Result Value Ref Range Status   Enterococcus species NOT DETECTED NOT DETECTED Final   Vancomycin resistance NOT DETECTED NOT DETECTED Final   Listeria monocytogenes NOT DETECTED NOT DETECTED Final   Staphylococcus species NOT DETECTED NOT DETECTED Final   Staphylococcus aureus NOT DETECTED NOT DETECTED Final   Methicillin resistance NOT DETECTED NOT DETECTED Final   Streptococcus species NOT DETECTED NOT DETECTED Final   Streptococcus  agalactiae NOT DETECTED NOT DETECTED Final   Streptococcus pneumoniae NOT DETECTED NOT DETECTED Final   Streptococcus pyogenes NOT DETECTED NOT DETECTED Final   Acinetobacter baumannii NOT DETECTED NOT DETECTED Final   Enterobacteriaceae species DETECTED (A) NOT DETECTED Final    Comment: CRITICAL RESULT CALLED TO, READ BACK BY AND VERIFIED WITH: NATE COOKSON ON 06/25/15 AT 0530 BY TLB    Enterobacter cloacae complex NOT DETECTED NOT DETECTED Final   Escherichia coli DETECTED (A) NOT DETECTED Final    Comment: CRITICAL RESULT CALLED TO, READ BACK BY AND VERIFIED WITH: NATE COOKSON ON 06/25/15 AT 0530 BY TLB    Klebsiella oxytoca NOT DETECTED NOT DETECTED Final   Klebsiella pneumoniae NOT DETECTED NOT DETECTED Final   Proteus species NOT DETECTED NOT DETECTED Final   Serratia marcescens NOT DETECTED NOT DETECTED Final   Carbapenem resistance NOT DETECTED NOT DETECTED Final   Haemophilus influenzae NOT DETECTED NOT DETECTED Final   Neisseria meningitidis NOT DETECTED NOT DETECTED Final   Pseudomonas aeruginosa NOT DETECTED NOT DETECTED Final   Candida albicans NOT DETECTED NOT DETECTED Final   Candida glabrata NOT DETECTED NOT DETECTED Final   Candida krusei NOT DETECTED NOT DETECTED Final   Candida parapsilosis NOT DETECTED NOT DETECTED Final   Candida tropicalis NOT DETECTED NOT DETECTED Final  Urine culture     Status: Abnormal (Preliminary result)   Collection Time: 06/24/15  3:04 PM  Result Value Ref Range Status   Specimen Description URINE, RANDOM  Final   Special Requests Normal  Final   Culture >=100,000 COLONIES/mL GRAM NEGATIVE RODS (A)  Final   Report Status PENDING  Incomplete  Culture, blood (routine x 2)     Status: None (Preliminary result)   Collection Time: 06/24/15  3:04 PM  Result Value Ref Range Status   Specimen Description BLOOD LAC  Final   Special Requests BOTTLES DRAWN AEROBIC AND ANAEROBIC 10CC  Final   Culture  Setup Time   Final    GRAM NEGATIVE  RODS IN BOTH AEROBIC AND ANAEROBIC BOTTLES CRITICAL RESULT CALLED TO, READ BACK BY AND VERIFIED WITH: NATE COOKSON ON 06/25/15 AT 0621 BY TLB CONFIRMED BY TLB/CAF/TCH    Culture   Final    GRAM NEGATIVE RODS IN BOTH AEROBIC AND ANAEROBIC BOTTLES IDENTIFICATION TO FOLLOW    Report Status PENDING  Incomplete     Studies: Ct Abdomen Pelvis W Contrast  06/24/2015  CLINICAL DATA:  Abdominal and flank pain (side not specified), back pain, nausea, and vomiting for several days EXAM: CT ABDOMEN AND PELVIS WITH CONTRAST TECHNIQUE: Multidetector CT imaging of the abdomen and pelvis was performed using the standard protocol following bolus administration of intravenous contrast. Sagittal and coronal MPR images reconstructed from axial data set. CONTRAST:  ISOVUE-300 IOPAMIDOL (ISOVUE-300) INJECTION 61%  IV. Dilute oral contrast. COMPARISON:  None FINDINGS: Lower chest:  Lung bases clear Hepatobiliary: Liver and gallbladder normal appearance. No biliary dilatation. Pancreas: Normal appearance Spleen: Normal appearance Adrenals/Urinary Tract: Normal appearing adrenal glands and kidneys. No urinary tract calcification or dilatation. Mild enhancement of the walls of the renal pelves and ureters is seen, nonspecific but could reflect urinary tract infection. Bladder unremarkable. Stomach/Bowel: Normal appendix. Stomach and bowel loops normal appearance. Vascular/Lymphatic: Vascular structures patent.  No adenopathy. Reproductive: Normal appearing uterus and adnexa Other: Small amount of nonspecific free pelvic fluid. No mass or free air. No hernia. Musculoskeletal: Normal appearance IMPRESSION: Mild enhancement of the walls of the renal pelves and ureters bilaterally, nonspecific but could reflect urinary tract infection; recommend correlation with urinalysis. No evidence of urinary tract obstruction or calcification. Remainder of exam unremarkable. Electronically Signed   By: Ulyses SouthwardMark  Boles M.D.   On: 06/24/2015  15:31    Scheduled Meds: . docusate sodium  100 mg Oral BID  . enoxaparin (LOVENOX) injection  40 mg Subcutaneous Q24H  . meropenem (MERREM) IV  1 g Intravenous Q8H  . potassium chloride  40 mEq Oral Once   Continuous Infusions: . sodium chloride 100 mL/hr at 06/25/15 1418    Assessment/Plan:  1. Sepsis secondary to acute pyelonephritis, fever, leukocytosis, tachycardia. Patient on IV meropenem until final sensitivities come back on the blood cultures. So far urine culture and blood culture likely Escherichia coli. 2. Recent ectopic pregnancy requiring surgery. 3. Hypokalemia replace potassium orally  Code Status:     Code Status Orders        Start     Ordered   06/24/15 2042  Full code   Continuous     06/24/15 2041    Code Status History    Date Active Date Inactive Code Status Order ID Comments User Context   This patient has a current code status but no historical code status.     Family Communication: Family at the bedside Disposition Plan: Home in a few days  Antibiotics:  Meropenem  Time spent: 25 minutes  Alford HighlandWIETING, Jahmal Dunavant  [facility]

## 2015-06-25 NOTE — Progress Notes (Signed)
Ambulatory in hall, husband at bedside, c/o back pain improved with oxycodone, headache improved with tylenol, low grade temp throughout shift, receiving IV antibiotics, showered independently. Likely to d/c on 5/28

## 2015-06-26 LAB — COMPREHENSIVE METABOLIC PANEL
ALBUMIN: 2.7 g/dL — AB (ref 3.5–5.0)
ALK PHOS: 57 U/L (ref 38–126)
ALT: 16 U/L (ref 14–54)
AST: 17 U/L (ref 15–41)
Anion gap: 4 — ABNORMAL LOW (ref 5–15)
BILIRUBIN TOTAL: 0.6 mg/dL (ref 0.3–1.2)
CALCIUM: 8.2 mg/dL — AB (ref 8.9–10.3)
CO2: 24 mmol/L (ref 22–32)
CREATININE: 0.6 mg/dL (ref 0.44–1.00)
Chloride: 110 mmol/L (ref 101–111)
GFR calc Af Amer: >60 mL/min (ref >60)
GLUCOSE: 106 mg/dL — AB (ref 65–99)
POTASSIUM: 3.3 mmol/L — AB (ref 3.5–5.1)
Sodium: 138 mmol/L (ref 135–145)
TOTAL PROTEIN: 6.2 g/dL — AB (ref 6.5–8.1)

## 2015-06-26 LAB — URINE CULTURE: Special Requests: NORMAL

## 2015-06-26 LAB — CBC
HEMATOCRIT: 28.3 % — AB (ref 35.0–47.0)
HEMOGLOBIN: 9.9 g/dL — AB (ref 12.0–16.0)
MCH: 28.6 pg (ref 26.0–34.0)
MCHC: 34.8 g/dL (ref 32.0–36.0)
MCV: 82.1 fL (ref 80.0–100.0)
Platelets: 218 10*3/uL (ref 150–440)
RBC: 3.45 MIL/uL — ABNORMAL LOW (ref 3.80–5.20)
RDW: 13.7 % (ref 11.5–14.5)
WBC: 8.4 10*3/uL (ref 3.6–11.0)

## 2015-06-26 MED ORDER — DEXTROSE 5 % IV SOLN
2.0000 g | INTRAVENOUS | Status: DC
  Administered 2015-06-26 – 2015-06-27 (×2): 2 g via INTRAVENOUS
  Filled 2015-06-26 (×2): qty 2

## 2015-06-26 NOTE — Progress Notes (Signed)
Patient ID: Barbara Huff, female   DOB: [birth year], 30 y.o.   MRN: [id number] [facility] PROGRESS NOTE  Barbara Huff WUJ:[id number] DOB: 27-Apr-1985 DOA: 06/24/2015 PCP: No PCP Per Patient  HPI/Subjective: Patient feeling a lot better today. Patient states food does not taste well. She was able to tolerate breakfast this morning.  Objective: Filed Vitals:   06/26/15 0514 06/26/15 1238  BP: 109/66 110/88  Pulse: 92 99  Temp: 98.8 F (37.1 C) 101.7 F (38.7 C)  Resp: 20     Filed Weights   06/24/15 1326  Weight: 81.194 kg (179 lb)    ROS: Review of Systems  Constitutional: Negative for fever and chills.  Eyes: Negative for blurred vision.  Respiratory: Negative for cough and shortness of breath.   Cardiovascular: Negative for chest pain.  Gastrointestinal: Negative for nausea, vomiting, abdominal pain, diarrhea and constipation.  Genitourinary: Negative for dysuria.  Musculoskeletal: Negative for joint pain.  Neurological: Negative for dizziness and headaches.   Exam: Physical Exam  Constitutional: She is oriented to person, place, and time.  HENT:  Nose: No mucosal edema.  Mouth/Throat: No oropharyngeal exudate or posterior oropharyngeal edema.  Eyes: Conjunctivae, EOM and lids are normal. Pupils are equal, round, and reactive to light.  Neck: No JVD present. Carotid bruit is not present. No edema present. No thyroid mass and no thyromegaly present.  Cardiovascular: S1 normal and S2 normal.  Tachycardia present.  Exam reveals no gallop.   No murmur heard. Pulses:      Dorsalis pedis pulses are 2+ on the right side, and 2+ on the left side.  Respiratory: No respiratory distress. She has no wheezes. She has no rhonchi. She has no rales.  GI: Soft. Bowel sounds are normal. There is no tenderness.  Musculoskeletal:       Right ankle: She exhibits no swelling.       Left ankle: She exhibits no swelling.  Lymphadenopathy:    She has no cervical adenopathy.   Neurological: She is alert and oriented to person, place, and time. No cranial nerve deficit.  Skin: Skin is warm. No rash noted. Nails show no clubbing.  Psychiatric: She has a normal mood and affect.      Data Reviewed: Basic Metabolic Panel:  Recent Labs Lab 06/24/15 1330 06/25/15 0237 06/26/15 0626  NA 135 139 138  K 3.1* 3.4* 3.3*  CL 103 109 110  CO2 22 24 24   GLUCOSE 165* 103* 106*  BUN 9 7 <5*  CREATININE 0.64 0.59 0.60  CALCIUM 8.7* 7.9* 8.2*   Liver Function Tests:  Recent Labs Lab 06/24/15 1330 06/26/15 0626  AST 20 17  ALT 19 16  ALKPHOS 63 57  BILITOT 1.9* 0.6  PROT 7.5 6.2*  ALBUMIN 3.6 2.7*    Recent Labs Lab 06/24/15 1330  LIPASE 18   CBC:  Recent Labs Lab 06/24/15 1330 06/25/15 0237 06/26/15 0626  WBC 13.0* 14.8* 8.4  HGB 11.9* 10.4* 9.9*  HCT 35.1 30.6* 28.3*  MCV 83.7 82.9 82.1  PLT 256 225 218     Recent Results (from the past 240 hour(s))  Culture, blood (routine x 2)     Status: Abnormal (Preliminary result)   Collection Time: 06/24/15  3:03 PM  Result Value Ref Range Status   Specimen Description BLOOD RT ARM  Final   Special Requests BOTTLES DRAWN AEROBIC AND ANAEROBIC 10CC  Final   Culture  Setup Time   Final    GRAM NEGATIVE RODS IN BOTH  AEROBIC AND ANAEROBIC BOTTLES CRITICAL RESULT CALLED TO, READ BACK BY AND VERIFIED WITH: NATE COOKSON ON 06/25/15 AT 0530 BY TLB CONFIREMD BY TLB/CAF    Culture (A)  Final    ESCHERICHIA COLI IN BOTH AEROBIC AND ANAEROBIC BOTTLES SUSCEPTIBILITIES TO FOLLOW    Report Status PENDING  Incomplete  Blood Culture ID Panel (Reflexed)     Status: Abnormal   Collection Time: 06/24/15  3:03 PM  Result Value Ref Range Status   Enterococcus species NOT DETECTED NOT DETECTED Final   Vancomycin resistance NOT DETECTED NOT DETECTED Final   Listeria monocytogenes NOT DETECTED NOT DETECTED Final   Staphylococcus species NOT DETECTED NOT DETECTED Final   Staphylococcus aureus NOT DETECTED NOT  DETECTED Final   Methicillin resistance NOT DETECTED NOT DETECTED Final   Streptococcus species NOT DETECTED NOT DETECTED Final   Streptococcus agalactiae NOT DETECTED NOT DETECTED Final   Streptococcus pneumoniae NOT DETECTED NOT DETECTED Final   Streptococcus pyogenes NOT DETECTED NOT DETECTED Final   Acinetobacter baumannii NOT DETECTED NOT DETECTED Final   Enterobacteriaceae species DETECTED (A) NOT DETECTED Final    Comment: CRITICAL RESULT CALLED TO, READ BACK BY AND VERIFIED WITH: NATE COOKSON ON 06/25/15 AT 0530 BY TLB    Enterobacter cloacae complex NOT DETECTED NOT DETECTED Final   Escherichia coli DETECTED (A) NOT DETECTED Final    Comment: CRITICAL RESULT CALLED TO, READ BACK BY AND VERIFIED WITH: NATE COOKSON ON 06/25/15 AT 0530 BY TLB    Klebsiella oxytoca NOT DETECTED NOT DETECTED Final   Klebsiella pneumoniae NOT DETECTED NOT DETECTED Final   Proteus species NOT DETECTED NOT DETECTED Final   Serratia marcescens NOT DETECTED NOT DETECTED Final   Carbapenem resistance NOT DETECTED NOT DETECTED Final   Haemophilus influenzae NOT DETECTED NOT DETECTED Final   Neisseria meningitidis NOT DETECTED NOT DETECTED Final   Pseudomonas aeruginosa NOT DETECTED NOT DETECTED Final   Candida albicans NOT DETECTED NOT DETECTED Final   Candida glabrata NOT DETECTED NOT DETECTED Final   Candida krusei NOT DETECTED NOT DETECTED Final   Candida parapsilosis NOT DETECTED NOT DETECTED Final   Candida tropicalis NOT DETECTED NOT DETECTED Final  Urine culture     Status: Abnormal   Collection Time: 06/24/15  3:04 PM  Result Value Ref Range Status   Specimen Description URINE, RANDOM  Final   Special Requests Normal  Final   Culture >=100,000 COLONIES/mL ESCHERICHIA COLI (A)  Final   Report Status 06/26/2015 FINAL  Final   Organism ID, Bacteria ESCHERICHIA COLI (A)  Final      Susceptibility   Escherichia coli - MIC*    AMPICILLIN >=32 RESISTANT Resistant     CEFAZOLIN <=4 SENSITIVE  Sensitive     CEFTRIAXONE <=1 SENSITIVE Sensitive     CIPROFLOXACIN 1 SENSITIVE Sensitive     GENTAMICIN >=16 RESISTANT Resistant     IMIPENEM <=0.25 SENSITIVE Sensitive     NITROFURANTOIN <=16 SENSITIVE Sensitive     TRIMETH/SULFA >=320 RESISTANT Resistant     AMPICILLIN/SULBACTAM 16 INTERMEDIATE Intermediate     PIP/TAZO <=4 SENSITIVE Sensitive     Extended ESBL NEGATIVE Sensitive     * >=100,000 COLONIES/mL ESCHERICHIA COLI  Culture, blood (routine x 2)     Status: None (Preliminary result)   Collection Time: 06/24/15  3:04 PM  Result Value Ref Range Status   Specimen Description BLOOD LAC  Final   Special Requests BOTTLES DRAWN AEROBIC AND ANAEROBIC 10CC  Final   Culture  Setup Time  Final    GRAM NEGATIVE RODS IN BOTH AEROBIC AND ANAEROBIC BOTTLES CRITICAL RESULT CALLED TO, READ BACK BY AND VERIFIED WITH: NATE COOKSON ON 06/25/15 AT 0621 BY TLB CONFIRMED BY TLB/CAF/TCH    Culture   Final    GRAM NEGATIVE RODS IN BOTH AEROBIC AND ANAEROBIC BOTTLES IDENTIFICATION TO FOLLOW    Report Status PENDING  Incomplete     Studies: Ct Abdomen Pelvis W Contrast  06/24/2015  CLINICAL DATA:  Abdominal and flank pain (side not specified), back pain, nausea, and vomiting for several days EXAM: CT ABDOMEN AND PELVIS WITH CONTRAST TECHNIQUE: Multidetector CT imaging of the abdomen and pelvis was performed using the standard protocol following bolus administration of intravenous contrast. Sagittal and coronal MPR images reconstructed from axial data set. CONTRAST:  100mL ISOVUE-300 IOPAMIDOL (ISOVUE-300) INJECTION 61% IV. Dilute oral contrast. COMPARISON:  None FINDINGS: Lower chest:  Lung bases clear Hepatobiliary: Liver and gallbladder normal appearance. No biliary dilatation. Pancreas: Normal appearance Spleen: Normal appearance Adrenals/Urinary Tract: Normal appearing adrenal glands and kidneys. No urinary tract calcification or dilatation. Mild enhancement of the walls of the renal pelves and  ureters is seen, nonspecific but could reflect urinary tract infection. Bladder unremarkable. Stomach/Bowel: Normal appendix. Stomach and bowel loops normal appearance. Vascular/Lymphatic: Vascular structures patent.  No adenopathy. Reproductive: Normal appearing uterus and adnexa Other: Small amount of nonspecific free pelvic fluid. No mass or free air. No hernia. Musculoskeletal: Normal appearance IMPRESSION: Mild enhancement of the walls of the renal pelves and ureters bilaterally, nonspecific but could reflect urinary tract infection; recommend correlation with urinalysis. No evidence of urinary tract obstruction or calcification. Remainder of exam unremarkable. Electronically Signed   By: Ulyses SouthwardMark  Boles M.D.   On: 06/24/2015 15:31    Scheduled Meds: . cefTRIAXone (ROCEPHIN)  IV  2 g Intravenous Q24H  . docusate sodium  100 mg Oral BID  . enoxaparin (LOVENOX) injection  40 mg Subcutaneous Q24H   Continuous Infusions: . sodium chloride 100 mL/hr at 06/26/15 0400    Assessment/Plan:  1. Sepsis secondary to acute pyelonephritis, fever, leukocytosis, tachycardia. Meropenem switched over to Rocephin. Hopefully switch over to Keflex upon discharge home. Escherichia coli growing in both blood cultures and urine culture. 2. Recent ectopic pregnancy requiring surgery. 3. Hypokalemia replace potassium orally  Code Status:     Code Status Orders        Start     Ordered   06/24/15 2042  Full code   Continuous     06/24/15 2041    Code Status History    Date Active Date Inactive Code Status Order ID Comments User Context   This patient has a current code status but no historical code status.     Family Communication: Family at the bedside Disposition Plan: Home Potentially tomorrow  Antibiotics: -Rocephin  Time spent: 25 minutes  Alford HighlandWIETING, Kandace Elrod  [employer]

## 2015-06-27 LAB — CULTURE, BLOOD (ROUTINE X 2)

## 2015-06-27 MED ORDER — CEPHALEXIN 500 MG PO CAPS: 500.0000 mg | ORAL_CAPSULE | Freq: Three times a day (TID) | ORAL | Status: DC

## 2015-06-27 NOTE — Discharge Summary (Signed)
[employer] - [place] at [facility]   PATIENT NAME: Barbara Huff    MR#:  [id number]  DATE OF BIRTH:  Nov 15, 1985  DATE OF ADMISSION:  06/24/2015 ADMITTING PHYSICIAN: Enid Baas, MD  DATE OF DISCHARGE: 06/27/2015  PRIMARY CARE PHYSICIAN: Dr. Daphine Deutscher DeFrancesco   ADMISSION DIAGNOSIS:  Dehydration [E86.0] Pyelonephritis [N12] Non-intractable vomiting with nausea, vomiting of unspecified type [R11.2]  DISCHARGE DIAGNOSIS:  Active Problems:   Acute pyelonephritis    HOSPITAL COURSE:   1. Sepsis. Escherichia coli growing in blood and urine cultures. Pyelonephritis. Patient initially given IV Rocephin and then it was switched to meropenem. Antibiotic switch back to IV Rocephin and upon discharge over to by mouth Keflex. A total of 14 day course to be given. Leukocytosis improved. Temperature curve improved 2. Nausea vomiting this has improved. 3. Recent ectopic pregnancy follow-up with Dr. Greggory Keen. 4. Anemia. Likely dilutional with IV fluid hydration 5. Hypokalemia replaced during the hospital course.   DISCHARGE CONDITIONS:   Satisfactory   DRUG ALLERGIES:   Allergies  Allergen Reactions  . Codeine Nausea And Vomiting    DISCHARGE MEDICATIONS:   Current Discharge Medication List    START taking these medications   Details  cephALEXin (KEFLEX) 500 MG capsule Take 1 capsule (500 mg total) by mouth every 8 (eight) hours. Qty: 30 capsule, Refills: 0      CONTINUE these medications which have NOT CHANGED   Details  ibuprofen (ADVIL,MOTRIN) 800 MG tablet Take 1 tablet (800 mg total) by mouth 3 (three) times daily. Qty: 50 tablet, Refills: 1         DISCHARGE INSTRUCTIONS:   Follow-up PMD one week  If you experience worsening of your admission symptoms, develop shortness of breath, life threatening emergency, suicidal or homicidal thoughts you must seek medical attention immediately by calling 911 or calling your MD immediately  if  symptoms less severe.  You Must read complete instructions/literature along with all the possible adverse reactions/side effects for all the Medicines you take and that have been prescribed to you. Take any new Medicines after you have completely understood and accept all the possible adverse reactions/side effects.   Please note  You were cared for by a hospitalist during your hospital stay. If you have any questions about your discharge medications or the care you received while you were in the hospital after you are discharged, you can call the unit and asked to speak with the hospitalist on call if the hospitalist that took care of you is not available. Once you are discharged, your primary care physician will handle any further medical issues. Please note that NO REFILLS for any discharge medications will be authorized once you are discharged, as it is imperative that you return to your primary care physician (or establish a relationship with a primary care physician if you do not have one) for your aftercare needs so that they can reassess your need for medications and monitor your lab values.    Today   CHIEF COMPLAINT:   Chief Complaint  Patient presents with  . Back Pain  . Headache  . Emesis    HISTORY OF PRESENT ILLNESS:  Barbara Huff  is a 30 y.o. female presented with back pain and headache and vomiting. Found to have clinical sepsis and pyelonephritis.   VITAL SIGNS:  Blood pressure 140/89, pulse 68, temperature 97.9 F (36.6 C), temperature source Oral, resp. rate 20, height 4\' 11"  (1.499 m), weight 81.194 kg (179 lb), last menstrual period  04/09/2015, SpO2 99 %.    PHYSICAL EXAMINATION:  GENERAL:  30 y.o.-year-old patient lying in the bed with no acute distress.  EYES: Pupils equal, round, reactive to light and accommodation. No scleral icterus. Extraocular muscles intact.  HEENT: Head atraumatic, normocephalic. Oropharynx and nasopharynx clear.  NECK:  Supple, no  jugular venous distention. No thyroid enlargement, no tenderness.  LUNGS: Normal breath sounds bilaterally, no wheezing, rales,rhonchi or crepitation. No use of accessory muscles of respiration.  CARDIOVASCULAR: S1, S2 normal. No murmurs, rubs, or gallops.  ABDOMEN: Soft, non-tender, non-distended. Bowel sounds present. No organomegaly or mass.  EXTREMITIES: No pedal edema, cyanosis, or clubbing.  NEUROLOGIC: Cranial nerves II through XII are intact. Muscle strength 5/5 in all extremities. Sensation intact. Gait not checked.  PSYCHIATRIC: The patient is alert and oriented x 3.  SKIN: No obvious rash, lesion, or ulcer.   DATA REVIEW:   CBC  Recent Labs Lab 06/26/15 0626  WBC 8.4  HGB 9.9*  HCT 28.3*  PLT 218    Chemistries   Recent Labs Lab 06/26/15 0626  NA 138  K 3.3*  CL 110  CO2 24  GLUCOSE 106*  BUN <5*  CREATININE 0.60  CALCIUM 8.2*  AST 17  ALT 16  ALKPHOS 57  BILITOT 0.6    Microbiology Results  Results for orders placed or performed during the hospital encounter of 06/24/15  Culture, blood (routine x 2)     Status: Abnormal   Collection Time: 06/24/15  3:03 PM  Result Value Ref Range Status   Specimen Description BLOOD RT ARM  Final   Special Requests BOTTLES DRAWN AEROBIC AND ANAEROBIC 10CC  Final   Culture  Setup Time   Final    GRAM NEGATIVE RODS IN BOTH AEROBIC AND ANAEROBIC BOTTLES CRITICAL RESULT CALLED TO, READ BACK BY AND VERIFIED WITH: NATE COOKSON ON 06/25/15 AT 0530 BY TLB CONFIREMD BY TLB/CAF    Culture (A)  Final    ESCHERICHIA COLI IN BOTH AEROBIC AND ANAEROBIC BOTTLES    Report Status 06/27/2015 FINAL  Final   Organism ID, Bacteria ESCHERICHIA COLI  Final      Susceptibility   Escherichia coli - MIC*    AMPICILLIN >=32 RESISTANT Resistant     CEFAZOLIN <=4 SENSITIVE Sensitive     CEFEPIME <=1 SENSITIVE Sensitive     CEFTAZIDIME <=1 SENSITIVE Sensitive     CEFTRIAXONE <=1 SENSITIVE Sensitive     CIPROFLOXACIN 1 SENSITIVE  Sensitive     GENTAMICIN >=16 RESISTANT Resistant     IMIPENEM <=0.25 SENSITIVE Sensitive     TRIMETH/SULFA >=320 RESISTANT Resistant     AMPICILLIN/SULBACTAM 16 INTERMEDIATE Intermediate     PIP/TAZO <=4 SENSITIVE Sensitive     Extended ESBL NEGATIVE Sensitive     * ESCHERICHIA COLI  Blood Culture ID Panel (Reflexed)     Status: Abnormal   Collection Time: 06/24/15  3:03 PM  Result Value Ref Range Status   Enterococcus species NOT DETECTED NOT DETECTED Final   Vancomycin resistance NOT DETECTED NOT DETECTED Final   Listeria monocytogenes NOT DETECTED NOT DETECTED Final   Staphylococcus species NOT DETECTED NOT DETECTED Final   Staphylococcus aureus NOT DETECTED NOT DETECTED Final   Methicillin resistance NOT DETECTED NOT DETECTED Final   Streptococcus species NOT DETECTED NOT DETECTED Final   Streptococcus agalactiae NOT DETECTED NOT DETECTED Final   Streptococcus pneumoniae NOT DETECTED NOT DETECTED Final   Streptococcus pyogenes NOT DETECTED NOT DETECTED Final   Acinetobacter baumannii NOT DETECTED  NOT DETECTED Final   Enterobacteriaceae species DETECTED (A) NOT DETECTED Final    Comment: CRITICAL RESULT CALLED TO, READ BACK BY AND VERIFIED WITH: NATE COOKSON ON 06/25/15 AT 0530 BY TLB    Enterobacter cloacae complex NOT DETECTED NOT DETECTED Final   Escherichia coli DETECTED (A) NOT DETECTED Final    Comment: CRITICAL RESULT CALLED TO, READ BACK BY AND VERIFIED WITH: NATE COOKSON ON 06/25/15 AT 0530 BY TLB    Klebsiella oxytoca NOT DETECTED NOT DETECTED Final   Klebsiella pneumoniae NOT DETECTED NOT DETECTED Final   Proteus species NOT DETECTED NOT DETECTED Final   Serratia marcescens NOT DETECTED NOT DETECTED Final   Carbapenem resistance NOT DETECTED NOT DETECTED Final   Haemophilus influenzae NOT DETECTED NOT DETECTED Final   Neisseria meningitidis NOT DETECTED NOT DETECTED Final   Pseudomonas aeruginosa NOT DETECTED NOT DETECTED Final   Candida albicans NOT DETECTED NOT  DETECTED Final   Candida glabrata NOT DETECTED NOT DETECTED Final   Candida krusei NOT DETECTED NOT DETECTED Final   Candida parapsilosis NOT DETECTED NOT DETECTED Final   Candida tropicalis NOT DETECTED NOT DETECTED Final  Urine culture     Status: Abnormal   Collection Time: 06/24/15  3:04 PM  Result Value Ref Range Status   Specimen Description URINE, RANDOM  Final   Special Requests Normal  Final   Culture >=100,000 COLONIES/mL ESCHERICHIA COLI (A)  Final   Report Status 06/26/2015 FINAL  Final   Organism ID, Bacteria ESCHERICHIA COLI (A)  Final      Susceptibility   Escherichia coli - MIC*    AMPICILLIN >=32 RESISTANT Resistant     CEFAZOLIN <=4 SENSITIVE Sensitive     CEFTRIAXONE <=1 SENSITIVE Sensitive     CIPROFLOXACIN 1 SENSITIVE Sensitive     GENTAMICIN >=16 RESISTANT Resistant     IMIPENEM <=0.25 SENSITIVE Sensitive     NITROFURANTOIN <=16 SENSITIVE Sensitive     TRIMETH/SULFA >=320 RESISTANT Resistant     AMPICILLIN/SULBACTAM 16 INTERMEDIATE Intermediate     PIP/TAZO <=4 SENSITIVE Sensitive     Extended ESBL NEGATIVE Sensitive     * >=100,000 COLONIES/mL ESCHERICHIA COLI  Culture, blood (routine x 2)     Status: Abnormal   Collection Time: 06/24/15  3:04 PM  Result Value Ref Range Status   Specimen Description BLOOD LAC  Final   Special Requests BOTTLES DRAWN AEROBIC AND ANAEROBIC 10CC  Final   Culture  Setup Time   Final    GRAM NEGATIVE RODS IN BOTH AEROBIC AND ANAEROBIC BOTTLES CRITICAL RESULT CALLED TO, READ BACK BY AND VERIFIED WITH: NATE COOKSON ON 06/25/15 AT 0621 BY TLB CONFIRMED BY TLB/CAF/TCH    Culture (A)  Final    ESCHERICHIA COLI IN BOTH AEROBIC AND ANAEROBIC BOTTLES REFER TO OTHER BLOOD CULTURE SET FOR SUSCEPTIBILITIES.    Report Status 06/27/2015 FINAL  Final    Management plans discussed with the patient, family and they are in agreement.  CODE STATUS:     Code Status Orders        Start     Ordered   06/24/15 2042  Full code    Continuous     06/24/15 2041    Code Status History    Date Active Date Inactive Code Status Order ID Comments User Context   This patient has a current code status but no historical code status.      TOTAL TIME TAKING CARE OF THIS PATIENT:  35minutes.    Alford HighlandWIETING, Tudor Chandley  M.D on 06/27/2015 at 2:01 PM  Between 7am to 6pm - Pager - [phone]  After 6pm go to [email or web address] - password [email or web address]  [employer] [facility] Office  [phone]  CC: Primary care physician; Dr. Daphine Deutscher DeFrancesco

## 2015-06-27 NOTE — Progress Notes (Signed)
Patient ID: Barbara Huff, female   DOB: 10-27-1985, 30 y.o.   MRN: [id number] [facility] - [place] at [facility]        Barbara GipMaria Bernardini was admitted to the Hospital on 06/24/2015 and Discharged  06/27/2015 and should be excused from work/school   for 9  days starting 06/24/2015 , may return to work/school without any restrictions.  Alford HighlandWIETING, Hue Frick M.D on 06/27/2015,at 2:37 PM  [facility] - [place] at [facility]    Office  [phone]

## 2015-06-27 NOTE — Progress Notes (Signed)
Discharge packet reviewed with patient. Patient verbalizes understanding. Prescription given to patient. Work note given to patient. Patient to be transported home by husband.

## 2016-09-07 ENCOUNTER — Emergency Department
Admission: EM | Admit: 2016-09-07 | Discharge: 2016-09-07 | Disposition: A | Discharge status: Home / Self Care | Payer: Self-pay | Attending: Emergency Medicine | Admitting: Emergency Medicine

## 2016-09-07 ENCOUNTER — Emergency Department: Payer: Self-pay

## 2016-09-07 ENCOUNTER — Encounter: Payer: Self-pay | Admitting: Medical Oncology

## 2016-09-07 DIAGNOSIS — W51XXXA Accidental striking against or bumped into by another person, initial encounter: Secondary | ICD-10-CM | POA: Insufficient documentation

## 2016-09-07 DIAGNOSIS — Y999 Unspecified external cause status: Secondary | ICD-10-CM | POA: Insufficient documentation

## 2016-09-07 DIAGNOSIS — Y929 Unspecified place or not applicable: Secondary | ICD-10-CM | POA: Insufficient documentation

## 2016-09-07 DIAGNOSIS — Y939 Activity, unspecified: Secondary | ICD-10-CM | POA: Insufficient documentation

## 2016-09-07 DIAGNOSIS — S92354A Nondisplaced fracture of fifth metatarsal bone, right foot, initial encounter for closed fracture: Secondary | ICD-10-CM

## 2016-09-07 MED ORDER — IBUPROFEN 600 MG PO TABS: 600.0000 mg | ORAL_TABLET | Freq: Four times a day (QID) | ORAL | 0 refills | Status: DC | PRN

## 2016-09-07 NOTE — ED Triage Notes (Signed)
Pt reports injuring rt foot yesterday.

## 2016-09-07 NOTE — ED Provider Notes (Signed)
[facility] Emergency Department Provider Note  ____________________________________________  Time seen: Approximately 11:55 AM  I have reviewed the triage vital signs and the nursing notes.   HISTORY  Chief Complaint Foot Pain    HPI Barbara Huff is a 31 y.o. female that presents to the emergency department with right foot pain after injuring it yesterday. Patient states that she stepped on her husband's foot. She says he has big feet so she thinks her foot rolled a bit. It has been bruising on the outside of her foot. She took Tylenol for pain. No additional injuries. She works as a Lawyer. She did not fall. No shortness breath, chest pain, nausea, vomiting, abdominal pain, numbness, tingling.    Past Medical History:  Diagnosis Date  . SAB (spontaneous abortion)     Patient Active Problem List   Diagnosis Date Noted  . Acute pyelonephritis 06/24/2015  . Status post laparoscopy 06/17/2015  . Unruptured tubal pregnancy 06/10/2015    Past Surgical History:  Procedure Laterality Date  . DIAGNOSTIC LAPAROSCOPY WITH REMOVAL OF ECTOPIC PREGNANCY Left 06/10/2015   Procedure: DIAGNOSTIC LAPAROSCOPY WITH REMOVAL OF ECTOPIC PREGNANCY;  Surgeon: Herold Harms, MD;  Location: [facility] ORS;  Service: Gynecology;  Laterality: Left;  . LAPAROSCOPIC LYSIS OF ADHESIONS N/A 06/10/2015   Procedure: LAPAROSCOPIC LYSIS OF ADHESIONS;  Surgeon: Herold Harms, MD;  Location: [facility] ORS;  Service: Gynecology;  Laterality: N/A;    Prior to Admission medications   Medication Sig Start Date End Date Taking? Authorizing Provider  cephALEXin (KEFLEX) 500 MG capsule Take 1 capsule (500 mg total) by mouth every 8 (eight) hours. 06/28/15   Alford Highland, MD  ibuprofen (ADVIL,MOTRIN) 600 MG tablet Take 1 tablet (600 mg total) by mouth every 6 (six) hours as needed. 09/07/16   Enid Derry, PA-C    Allergies Codeine  Family History  Problem Relation Age of Onset  . Asthma  Maternal Grandmother   . Colon cancer Maternal Grandmother   . Diabetes Maternal Grandmother   . Ovarian cancer Neg Hx     Social History Social History  Substance Use Topics  . Smoking status: Never Smoker  . Smokeless tobacco: Not on file  . Alcohol use No     Review of Systems  Cardiovascular: No chest pain. Respiratory:  No SOB. Gastrointestinal: No abdominal pain.  No nausea, no vomiting.  Musculoskeletal: Positive for foot pain. Skin: Negative for rash, abrasions, lacerations. Positive for ecchymosis. Neurological: Negative for headaches, numbness or tingling   ____________________________________________   PHYSICAL EXAM:  VITAL SIGNS: ED Triage Vitals [09/07/16 0946]  Enc Vitals Group     BP 120/69     Pulse Rate 86     Resp 18     Temp 98.6 F (37 C)     Temp Source Oral     SpO2 100 %     Weight 182 lb (82.6 kg)     Height 4\' 11"  (1.499 m)     Head Circumference      Peak Flow      Pain Score 10     Pain Loc      Pain Edu?      Excl. in GC?      Constitutional: Alert and oriented. Well appearing and in no acute distress. Eyes: Conjunctivae are normal. PERRL. EOMI. Head: Atraumatic. ENT:      Ears:      Nose: No congestion/rhinnorhea.      Mouth/Throat: Mucous membranes are moist.  Neck: No stridor.   Cardiovascular: Normal rate, regular rhythm.  Good peripheral circulation. 2+ dorsalis pedis pulses. Respiratory: Normal respiratory effort without tachypnea or retractions. Lungs CTAB. Good air entry to the bases with no decreased or absent breath sounds. Musculoskeletal: Full range of motion to all extremities. No gross deformities appreciated. Mild swelling and bruising to lateral side of right foot. Full range of motion of ankles and toes. Neurologic:  Normal speech and language. No gross focal neurologic deficits are appreciated. Sensation of toes intact. Skin:  Skin is warm, dry and intact.    ____________________________________________    LABS (all labs ordered are listed, but only abnormal results are displayed)  Labs Reviewed - No data to display ____________________________________________  EKG   ____________________________________________  RADIOLOGY Lexine BatonI, Catalina Salasar, personally viewed and evaluated these images (plain radiographs) as part of my medical decision making, as well as reviewing the written report by the radiologist.  Dg Foot Complete Right  Result Date: 09/07/2016 CLINICAL DATA:  Injured foot yesterday with pain when bearing weight EXAM: RIGHT FOOT COMPLETE - 3+ VIEW COMPARISON:  None. FINDINGS: There is a nondisplaced transverse fracture through the base of the right fifth metatarsal with soft tissue swelling. No other acute abnormality is seen. Alignment is normal. Joint spaces appear normal. IMPRESSION: Nondisplaced transverse fracture through the base of the right fifth metatarsal. Electronically Signed   By: Dwyane DeePaul  Barry M.D.   On: 09/07/2016 10:50    ____________________________________________    PROCEDURES  Procedure(s) performed:    Procedures    Medications - No data to display   ____________________________________________   INITIAL IMPRESSION / ASSESSMENT AND PLAN / ED COURSE  Pertinent labs & imaging results that were available during my care of the patient were reviewed by me and considered in my medical decision making (see chart for details).  Review of the [facility] CSRS was performed in accordance of the [facility] prior to dispensing any controlled drugs.     Patient's diagnosis is consistent with fifth metatarsal fracture. Vital signs and exam are reassuring. X-ray consistent with metatarsal fracture. Foot is neurovascularly intact. Postop shoe and crutches were given. Patient will be discharged home with prescriptions for ibuprofen. Patient is to follow up with orthopedics as directed. Patient is given ED precautions to return to the ED for any worsening or new  symptoms.     ____________________________________________  FINAL CLINICAL IMPRESSION(S) / ED DIAGNOSES  Final diagnoses:  Closed nondisplaced fracture of fifth metatarsal bone of right foot, initial encounter      NEW MEDICATIONS STARTED DURING THIS VISIT:  Discharge Medication List as of 09/07/2016 12:02 PM          This chart was dictated using voice recognition software/Dragon. Despite best efforts to proofread, errors can occur which can change the meaning. Any change was purely unintentional.    Enid DerryWagner, Gavina Dildine, PA-C 09/07/16 1303    Pershing ProudSchaevitz, Myra Rudeavid Matthew, MD 09/07/16 [id number]

## 2016-12-19 ENCOUNTER — Telehealth: Payer: Self-pay | Admitting: Obstetrics and Gynecology

## 2016-12-19 ENCOUNTER — Telehealth: Payer: Self-pay | Admitting: Certified Nurse Midwife

## 2016-12-19 NOTE — Telephone Encounter (Signed)
Patient called stating she is extremely nauseous and nothing is helping. She is requesting [facility].. Thanks

## 2016-12-19 NOTE — Telephone Encounter (Signed)
Pt had a pos hpt on 12/07/2016. lmp 11/07/2016 EDD 08/14/2017- Pt c/o of being nauseous all day. Had vomiting several weeks ago. None now. N/V protocal given. Advised b6 25-50mg  tid and 1/2 tab unisom at bedtime. (pt has no insurance) Confirmation scheduled for 11/28.

## 2016-12-27 ENCOUNTER — Encounter: Payer: Self-pay | Admitting: Obstetrics and Gynecology

## 2016-12-27 ENCOUNTER — Ambulatory Visit: Payer: Self-pay | Admitting: Obstetrics and Gynecology

## 2016-12-27 VITALS — BP 120/73 | HR 82 | Ht 59.0 in | Wt 177.8 lb

## 2016-12-27 DIAGNOSIS — E669 Obesity, unspecified: Secondary | ICD-10-CM

## 2016-12-27 DIAGNOSIS — Z9889 Other specified postprocedural states: Secondary | ICD-10-CM

## 2016-12-27 DIAGNOSIS — E66811 Obesity, class 1: Secondary | ICD-10-CM | POA: Insufficient documentation

## 2016-12-27 DIAGNOSIS — Z3201 Encounter for pregnancy test, result positive: Secondary | ICD-10-CM

## 2016-12-27 DIAGNOSIS — O00109 Unspecified tubal pregnancy without intrauterine pregnancy: Secondary | ICD-10-CM

## 2016-12-27 DIAGNOSIS — N912 Amenorrhea, unspecified: Secondary | ICD-10-CM

## 2016-12-27 DIAGNOSIS — N736 Female pelvic peritoneal adhesions (postinfective): Secondary | ICD-10-CM

## 2016-12-27 LAB — POCT URINE PREGNANCY: PREG TEST UR: POSITIVE — AB

## 2016-12-27 MED ORDER — CONCEPT DHA 53.5-38-1 MG PO CAPS: 1.0000 mg | ORAL_CAPSULE | Freq: Every day | ORAL | 11 refills | Status: DC

## 2016-12-27 NOTE — Progress Notes (Signed)
GYN ENCOUNTER NOTE  Subjective:       Barbara Huff is a 31 y.o. 553P0020 female is here for gynecologic evaluation of the following issues:  1. Pregnancy confirmation.    Last menstrual period-11/07/2016 (sure) EDD-08/14/2017 EGA-7.1 weeks Home pregnancy test positive-12/17/2016  Unplanned but desired pregnancy. Patient reports no significant pelvic pain, no significant vaginal bleeding; nausea without vomiting, and she is able to keep by mouth food and fluids down. She has not been taking prenatal vitamins.  Significant prenatal risk factors:  History of ectopic pregnancy 2017, status post left salpingectomy; significant pelvic adhesive disease was noted at that time.  History of SAB not requiring a D&C.  Obesity  Obstetric History OB History  Gravida Para Term Preterm AB Living  3       2    SAB TAB Ectopic Multiple Live Births  1   1        # Outcome Date GA Lbr Len/2nd Weight Sex Delivery Anes PTL Lv  3 Current           2 SAB 12/2014          1 Ectopic               Past Medical History:  Diagnosis Date  . SAB (spontaneous abortion)     Past Surgical History:  Procedure Laterality Date  . DIAGNOSTIC LAPAROSCOPY WITH REMOVAL OF ECTOPIC PREGNANCY Left 06/10/2015   Procedure: DIAGNOSTIC LAPAROSCOPY WITH REMOVAL OF ECTOPIC PREGNANCY;  Surgeon: Herold HarmsMartin A Jodene Polyak, MD;  Location: [facility] ORS;  Service: Gynecology;  Laterality: Left;  . LAPAROSCOPIC LYSIS OF ADHESIONS N/A 06/10/2015   Procedure: LAPAROSCOPIC LYSIS OF ADHESIONS;  Surgeon: Herold HarmsMartin A Marselino Slayton, MD;  Location: [facility] ORS;  Service: Gynecology;  Laterality: N/A;    No current outpatient medications on file prior to visit.   No current facility-administered medications on file prior to visit.     Allergies  Allergen Reactions  . Codeine Nausea And Vomiting    Social History   Socioeconomic History  . Marital status: Married    Spouse name: Not on file  . Number of children: Not on file  . Years of  education: Not on file  . Highest education level: Not on file  Social Needs  . Financial resource strain: Not on file  . Food insecurity - worry: Not on file  . Food insecurity - inability: Not on file  . Transportation needs - medical: Not on file  . Transportation needs - non-medical: Not on file  Occupational History  . Not on file  Tobacco Use  . Smoking status: Never Smoker  . Smokeless tobacco: Never Used  Substance and Sexual Activity  . Alcohol use: No  . Drug use: No  . Sexual activity: Yes    Birth control/protection: None  Other Topics Concern  . Not on file  Social History Narrative   Lives at home with husband    Family History  Problem Relation Age of Onset  . Asthma Maternal Grandmother   . Colon cancer Maternal Grandmother   . Diabetes Maternal Grandmother   . Ovarian cancer Neg Hx     The following portions of the patient's history were reviewed and updated as appropriate: allergies, current medications, past family history, past medical history, past social history, past surgical history and problem list.  Review of Systems Review of Systems -comprehensive review of systems is negative except for that noted in the history of present illness  Objective:  BP 120/73   Pulse 82   Ht 4\' 11"  (1.499 m)   Wt 177 lb 12.8 oz (80.6 kg)   LMP 11/07/2016 (Exact Date)   BMI 35.91 kg/m  Physical exam-deferred   Assessment:   1. Amenorrhea  2. Positive urine pregnancy test  3. 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 Status post laparoscopic excision of tubal pregnancy-2017  5. History of unruptured tubal pregnancy-2017     Plan:   1. Begin prenatal vitamins 2. Ultrasound within 1 week to confirm viability and intrauterine location of pregnancy 3. Return in 3 weeks for new OB nursing intake 4. Return in 5 weeks for new OB history and physical 5. New OB counseling: The patient has been given an overview regarding  routine prenatal care. Recommendations regarding diet, weight gain, and exercise in pregnancy were given. Prenatal testing, optional genetic testing, and ultrasound use in pregnancy were reviewed.  Benefits of Breast Feeding were discussed. The patient is encouraged to consider nursing her baby post partum.  A total of 25 minutes were spent face-to-face with the patient during this encounter and over half of that time involved counseling and coordination of care.  Herold HarmsMartin A Alka Falwell, MD  Note: This dictation was prepared with Dragon dictation along with smaller phrase technology. Any transcriptional errors that result from this process are unintentional.

## 2016-12-27 NOTE — Patient Instructions (Addendum)
1.  Ultrasound is scheduled to confirm fetal viability in pregnancy 2.  Recommend prenatal vitamin daily 3.  Return for new OB intake in 3 weeks 4.  Return in 5 weeks for new OB history and physical   Prenatal Care WHAT IS PRENATAL CARE? Prenatal care is the process of caring for a pregnant woman before she gives birth. Prenatal care makes sure that she and her baby remain as healthy as possible throughout pregnancy. Prenatal care may be provided by a midwife, family practice health care provider, or a childbirth and pregnancy specialist (obstetrician). Prenatal care may include physical examinations, testing, treatments, and education on nutrition, lifestyle, and social support services. WHY IS PRENATAL CARE SO IMPORTANT? Early and consistent prenatal care increases the chance that you and your baby will remain healthy throughout your pregnancy. This type of care also decreases a baby's risk of being born too early (prematurely), or being born smaller than expected (small for gestational age). Any underlying medical conditions you may have that could pose a risk during your pregnancy are discussed during prenatal care visits. You will also be monitored regularly for any new conditions that may arise during your pregnancy so they can be treated quickly and effectively. WHAT HAPPENS DURING PRENATAL CARE VISITS? Prenatal care visits may include the following: Discussion Tell your health care provider about any new signs or symptoms you have experienced since your last visit. These might include:  Nausea or vomiting.  Increased or decreased level of energy.  Difficulty sleeping.  Back or leg pain.  Weight changes.  Frequent urination.  Shortness of breath with physical activity.  Changes in your skin, such as the development of a rash or itchiness.  Vaginal discharge or bleeding.  Feelings of excitement or nervousness.  Changes in your baby's movements.  You may want to write down  any questions or topics you want to discuss with your health care provider and bring them with you to your appointment. Examination During your first prenatal care visit, you will likely have a complete physical exam. Your health care provider will often examine your vagina, cervix, and the position of your uterus, as well as check your heart, lungs, and other body systems. As your pregnancy progresses, your health care provider will measure the size of your uterus and your baby's position inside your uterus. He or she may also examine you for early signs of labor. Your prenatal visits may also include checking your blood pressure and, after about 10-12 weeks of pregnancy, listening to your baby's heartbeat. Testing Regular testing often includes:  Urinalysis. This checks your urine for glucose, protein, or signs of infection.  Blood count. This checks the levels of white and red blood cells in your body.  Tests for sexually transmitted infections (STIs). Testing for STIs at the beginning of pregnancy is routinely done and is required in many states.  Antibody testing. You will be checked to see if you are immune to certain illnesses, such as rubella, that can affect a developing fetus.  Glucose screen. Around 24-28 weeks of pregnancy, your blood glucose level will be checked for signs of gestational diabetes. Follow-up tests may be recommended.  Group B strep. This is a bacteria that is commonly found inside a woman's vagina. This test will inform your health care provider if you need an antibiotic to reduce the amount of this bacteria in your body prior to labor and childbirth.  Ultrasound. Many pregnant women undergo an ultrasound screening around 18-20 weeks of  pregnancy to evaluate the health of the fetus and check for any developmental abnormalities.  HIV (human immunodeficiency virus) testing. Early in your pregnancy, you will be screened for HIV. If you are at high risk for HIV, this test  may be repeated during your third trimester of pregnancy.  You may be offered other testing based on your age, personal or family medical history, or other factors. HOW OFTEN SHOULD I PLAN TO SEE MY HEALTH CARE PROVIDER FOR PRENATAL CARE? Your prenatal care check-up schedule depends on any medical conditions you have before, or develop during, your pregnancy. If you do not have any underlying medical conditions, you will likely be seen for checkups:  Monthly, during the first 6 months of pregnancy.  Twice a month during months 7 and 8 of pregnancy.  Weekly starting in the 9th month of pregnancy and until delivery.  If you develop signs of early labor or other concerning signs or symptoms, you may need to see your health care provider more often. Ask your health care provider what prenatal care schedule is best for you. WHAT CAN I DO TO KEEP MYSELF AND MY BABY AS HEALTHY AS POSSIBLE DURING MY PREGNANCY?  Take a prenatal vitamin containing 400 micrograms (0.4 mg) of folic acid every day. Your health care provider may also ask you to take additional vitamins such as iodine, vitamin D, iron, copper, and zinc.  Take 1500-2000 mg of calcium daily starting at your 20th week of pregnancy until you deliver your baby.  Make sure you are up to date on your vaccinations. Unless directed otherwise by your health care provider: ? You should receive a tetanus, diphtheria, and pertussis (Tdap) vaccination between the 27th and 36th week of your pregnancy, regardless of when your last Tdap immunization occurred. This helps protect your baby from whooping cough (pertussis) after he or she is born. ? You should receive an annual inactivated influenza vaccine (IIV) to help protect you and your baby from influenza. This can be done at any point during your pregnancy.  Eat a well-rounded diet that includes: ? Fresh fruits and vegetables. ? Lean proteins. ? Calcium-rich foods such as milk, yogurt, hard cheeses,  and dark, leafy greens. ? Whole grain breads.  Do noteat seafood high in mercury, including: ? Swordfish. ? Tilefish. ? Shark. ? King mackerel. ? More than 6 oz tuna per week.  Do not eat: ? Raw or undercooked meats or eggs. ? Unpasteurized foods, such as soft cheeses (brie, blue, or feta), juices, and milks. ? Lunch meats. ? Hot dogs that have not been heated until they are steaming.  Drink enough water to keep your urine clear or pale yellow. For many women, this may be 10 or more 8 oz glasses of water each day. Keeping yourself hydrated helps deliver nutrients to your baby and may prevent the start of pre-term uterine contractions.  Do not use any tobacco products including cigarettes, chewing tobacco, or electronic cigarettes. If you need help quitting, ask your health care provider.  Do not drink beverages containing alcohol. No safe level of alcohol consumption during pregnancy has been determined.  Do not use any illegal drugs. These can harm your developing baby or cause a miscarriage.  Ask your health care provider or pharmacist before taking any prescription or over-the-counter medicines, herbs, or supplements.  Limit your caffeine intake to no more than 200 mg per day.  Exercise. Unless told otherwise by your health care provider, try to get 30 minutes of  moderate exercise most days of the week. Do not  do high-impact activities, contact sports, or activities with a high risk of falling, such as horseback riding or downhill skiing.  Get plenty of rest.  Avoid anything that raises your body temperature, such as hot tubs and saunas.  If you own a cat, do not empty its litter box. Bacteria contained in cat feces can cause an infection called toxoplasmosis. This can result in serious harm to the fetus.  Stay away from chemicals such as insecticides, lead, mercury, and cleaning or paint products that contain solvents.  Do not have any X-rays taken unless medically  necessary.  Take a childbirth and breastfeeding preparation class. Ask your health care provider if you need a referral or recommendation.  This information is not intended to replace advice given to you by your health care provider. Make sure you discuss any questions you have with your health care provider. Document Released: 01/19/2003 Document Revised: 06/21/2015 Document Reviewed: 04/02/2013 [employer]vier Interactive Patient Education  2017 [employer]

## 2017-01-03 ENCOUNTER — Ambulatory Visit (INDEPENDENT_AMBULATORY_CARE_PROVIDER_SITE_OTHER): Payer: Self-pay

## 2017-01-03 DIAGNOSIS — O00109 Unspecified tubal pregnancy without intrauterine pregnancy: Secondary | ICD-10-CM

## 2017-01-04 NOTE — Telephone Encounter (Signed)
error 

## 2017-01-19 ENCOUNTER — Ambulatory Visit (INDEPENDENT_AMBULATORY_CARE_PROVIDER_SITE_OTHER): Payer: Self-pay | Admitting: Obstetrics and Gynecology

## 2017-01-19 VITALS — BP 110/78 | HR 94 | Ht 59.0 in | Wt 171.6 lb

## 2017-01-19 DIAGNOSIS — Z202 Contact with and (suspected) exposure to infections with a predominantly sexual mode of transmission: Secondary | ICD-10-CM

## 2017-01-19 DIAGNOSIS — O219 Vomiting of pregnancy, unspecified: Secondary | ICD-10-CM

## 2017-01-19 DIAGNOSIS — Z9889 Other specified postprocedural states: Secondary | ICD-10-CM

## 2017-01-19 DIAGNOSIS — O00109 Unspecified tubal pregnancy without intrauterine pregnancy: Secondary | ICD-10-CM

## 2017-01-19 DIAGNOSIS — Z3481 Encounter for supervision of other normal pregnancy, first trimester: Secondary | ICD-10-CM

## 2017-01-19 DIAGNOSIS — E669 Obesity, unspecified: Secondary | ICD-10-CM

## 2017-01-19 NOTE — Progress Notes (Signed)
Barbara Huff presents for NOB nurse interview visit. Dating scan on  01/17/2017 showed SUIP at 8 2/7 with edd of 08/14/2017. Pregnancy confirmation done ___with mad on _11/28/2018__.  G-3 .  [id number]  . Pregnancy education material explained and given. __0__ cats in the home. NOB labs ordered. TSH/HbgA1c ordered due to Increased BMI. HIV labs and Drug screen were explained  and ordered.  PNV encouraged. Pt has mild n/v. Encouraged pt to try B6 50mg  tid and 1/2 tab unisom at bedtime. Stay hydrated with H20, ginger ale, or Gatorade. Small frequent meals. Genetic screening options discussed. Genetic testing: to discuss with DJE at NOB [facility].  Pt has never had a pap smear. Flu vaccine - 09/2016. Pt. To follow up with DJE on 02/07/2017 as scheduled for NOB physical.  All questions answered.  Pt left without having her b/w drawn. Pt is self pay. Advised her to go th [facility] to get Pregnancy Medicaid- asap.   I agree with the above report.  [name] J. Evans, M.D. 02/06/2017 9:59 AM

## 2017-01-19 NOTE — Patient Instructions (Signed)
WHAT OB PATIENTS CAN EXPECT   Confirmation of pregnancy and ultrasound ordered if medically indicated-[redacted] weeks gestation  New OB (NOB) intake with nurse and New OB (NOB) labs- [redacted] weeks gestation  New OB (NOB) physical examination with provider- 11/[redacted] weeks gestation  Flu vaccine-[redacted] weeks gestation  Anatomy scan-[redacted] weeks gestation  Glucose tolerance test, blood work to test for anemia, T-dap vaccine-[redacted] weeks gestation  Vaginal swabs/cultures-STD/Group B strep-[redacted] weeks gestation  Appointments every 4 weeks until 28 weeks  Every 2 weeks from 28 weeks until 36 weeks  Weekly visits from 36 weeks until delivery  Morning Sickness Morning sickness is when you feel sick to your stomach (nauseous) during pregnancy. You may feel sick to your stomach and throw up (vomit). You may feel sick in the morning, but you can feel this way any time of day. Some women feel very sick to their stomach and cannot stop throwing up (hyperemesis gravidarum). Follow these instructions at home:  Only take medicines as told by your doctor.  Take multivitamins as told by your doctor. Taking multivitamins before getting pregnant can stop or lessen the harshness of morning sickness.  Eat dry toast or unsalted crackers before getting out of bed.  Eat 5 to 6 small meals a day.  Eat dry and bland foods like rice and baked potatoes.  Do not drink liquids with meals. Drink between meals.  Do not eat greasy, fatty, or spicy foods.  Have someone cook for you if the smell of food causes you to feel sick or throw up.  If you feel sick to your stomach after taking prenatal vitamins, take them at night or with a snack.  Eat protein when you need a snack (nuts, yogurt, cheese).  Eat unsweetened gelatins for dessert.  Wear a bracelet used for sea sickness (acupressure wristband).  Go to a doctor that puts thin needles into certain body points (acupuncture) to improve how you feel.  Do not smoke.  Use a  humidifier to keep the air in your house free of odors.  Get lots of fresh air. Contact a doctor if:  You need medicine to feel better.  You feel dizzy or lightheaded.  You are losing weight. Get help right away if:  You feel very sick to your stomach and cannot stop throwing up.  You pass out (faint). This information is not intended to replace advice given to you by your health care provider. Make sure you discuss any questions you have with your health care provider. Document Released: 02/24/2004 Document Revised: 06/24/2015 Document Reviewed: 07/03/2012 Elsevier Interactive Patient Education  2017 [employer]. How a Baby Grows During Pregnancy Pregnancy begins when a female's sperm enters a female's egg (fertilization). This happens in one of the tubes (fallopian tubes) that connect the ovaries to the womb (uterus). The fertilized egg is called an embryo until it reaches 10 weeks. From 10 weeks until birth, it is called a fetus. The fertilized egg moves down the fallopian tube to the uterus. Then it implants into the lining of the uterus and begins to grow. The developing fetus receives oxygen and nutrients through the pregnant woman's bloodstream and the tissues that grow (placenta) to support the fetus. The placenta is the life support system for the fetus. It provides nutrition and removes waste. Learning as much as you can about your pregnancy and how your baby is developing can help you enjoy the experience. It can also make you aware of when there might be a problem  and when to ask questions. How long does a typical pregnancy last? A pregnancy usually lasts 280 days, or about 40 weeks. Pregnancy is divided into three trimesters:  First trimester: 0-13 weeks.  Second trimester: 14-27 weeks.  Third trimester: 28-40 weeks.  The day when your baby is considered ready to be born (full term) is your estimated date of delivery. How does my baby develop month by month? First  month  The fertilized egg attaches to the inside of the uterus.  Some cells will form the placenta. Others will form the fetus.  The arms, legs, brain, spinal cord, lungs, and heart begin to develop.  At the end of the first month, the heart begins to beat.  Second month  The bones, inner ear, eyelids, hands, and feet form.  The genitals develop.  By the end of 8 weeks, all major organs are developing.  Third month  All of the internal organs are forming.  Teeth develop below the gums.  Bones and muscles begin to grow. The spine can flex.  The skin is transparent.  Fingernails and toenails begin to form.  Arms and legs continue to grow longer, and hands and feet develop.  The fetus is about 3 in (7.6 cm) long.  Fourth month  The placenta is completely formed.  The external sex organs, neck, outer ear, eyebrows, eyelids, and fingernails are formed.  The fetus can hear, swallow, and move its arms and legs.  The kidneys begin to produce urine.  The skin is covered with a white waxy coating (vernix) and very fine hair (lanugo).  Fifth month  The fetus moves around more and can be felt for the first time (quickening).  The fetus starts to sleep and wake up and may begin to suck its finger.  The nails grow to the end of the fingers.  The organ in the digestive system that makes bile (gallbladder) functions and helps to digest the nutrients.  If your baby is a girl, eggs are present in her ovaries. If your baby is a boy, testicles start to move down into his scrotum.  Sixth month  The lungs are formed, but the fetus is not yet able to breathe.  The eyes open. The brain continues to develop.  Your baby has fingerprints and toe prints. Your baby's hair grows thicker.  At the end of the second trimester, the fetus is about 9 in (22.9 cm) long.  Seventh month  The fetus kicks and stretches.  The eyes are developed enough to sense changes in light.  The  hands can make a grasping motion.  The fetus responds to sound.  Eighth month  All organs and body systems are fully developed and functioning.  Bones harden and taste buds develop. The fetus may hiccup.  Certain areas of the brain are still developing. The skull remains soft.  Ninth month  The fetus gains about  lb (0.23 kg) each week.  The lungs are fully developed.  Patterns of sleep develop.  The fetus's head typically moves into a head-down position (vertex) in the uterus to prepare for birth. If the buttocks move into a vertex position instead, the baby is breech.  The fetus weighs 6-9 lbs (2.72-4.08 kg) and is 19-20 in (48.26-50.8 cm) long.  What can I do to have a healthy pregnancy and help my baby develop? Eating and Drinking  Eat a healthy diet. ? Talk with your health care provider to make sure that you are getting the  nutrients that you and your baby need. ? Visit www.BuildDNA.es to learn about creating a healthy diet.  Gain a healthy amount of weight during pregnancy as advised by your health care provider. This is usually 25-35 pounds. You may need to: ? Gain more if you were underweight before getting pregnant or if you are pregnant with more than one baby. ? Gain less if you were overweight or obese when you got pregnant.  Medicines and Vitamins  Take prenatal vitamins as directed by your health care provider. These include vitamins such as folic acid, iron, calcium, and vitamin D. They are important for healthy development.  Take medicines only as directed by your health care provider. Read labels and ask a pharmacist or your health care provider whether over-the-counter medicines, supplements, and prescription drugs are safe to take during pregnancy.  Activities  Be physically active as advised by your health care provider. Ask your health care provider to recommend activities that are safe for you to do, such as walking or swimming.  Do not  participate in strenuous or extreme sports.  Lifestyle  Do not drink alcohol.  Do not use any tobacco products, including cigarettes, chewing tobacco, or electronic cigarettes. If you need help quitting, ask your health care provider.  Do not use illegal drugs.  Safety  Avoid exposure to mercury, lead, or other heavy metals. Ask your health care provider about common sources of these heavy metals.  Avoid listeria infection during pregnancy. Follow these precautions: ? Do not eat soft cheeses or deli meats. ? Do not eat hot dogs unless they have been warmed up to the point of steaming, such as in the microwave oven. ? Do not drink unpasteurized milk.  Avoid toxoplasmosis infection during pregnancy. Follow these precautions: ? Do not change your cat's litter box, if you have a cat. Ask someone else to do this for you. ? Wear gardening gloves while working in the yard.  General Instructions  Keep all follow-up visits as directed by your health care provider. This is important. This includes prenatal care and screening tests.  Manage any chronic health conditions. Work closely with your health care provider to keep conditions, such as diabetes, under control.  How do I know if my baby is developing well? At each prenatal visit, your health care provider will do several different tests to check on your health and keep track of your baby's development. These include:  Fundal height. ? Your health care provider will measure your growing belly from top to bottom using a tape measure. ? Your health care provider will also feel your belly to determine your baby's position.  Heartbeat. ? An ultrasound in the first trimester can confirm pregnancy and show a heartbeat, depending on how far along you are. ? Your health care provider will check your baby's heart rate at every prenatal visit. ? As you get closer to your delivery date, you may have regular fetal heart rate monitoring to make  sure that your baby is not in distress.  Second trimester ultrasound. ? This ultrasound checks your baby's development. It also indicates your baby's gender.  What should I do if I have concerns about my baby's development? Always talk with your health care provider about any concerns that you may have. This information is not intended to replace advice given to you by your health care provider. Make sure you discuss any questions you have with your health care provider. Document Released: 07/05/2007 Document Revised: 06/24/2015 Document Reviewed:  06/25/2013 Elsevier Interactive Patient Education  2018 [place] Trimester of Pregnancy The first trimester of pregnancy is from week 1 until the end of week 13 (months 1 through 3). A week after a sperm fertilizes an egg, the egg will implant on the wall of the uterus. This embryo will begin to develop into a baby. Genes from you and your partner will form the baby. The female genes will determine whether the baby will be a boy or a girl. At 6-8 weeks, the eyes and face will be formed, and the heartbeat can be seen on ultrasound. At the end of 12 weeks, all the baby's organs will be formed. Now that you are pregnant, you will want to do everything you can to have a healthy baby. Two of the most important things are to get good prenatal care and to follow your health care provider's instructions. Prenatal care is all the medical care you receive before the baby's birth. This care will help prevent, find, and treat any problems during the pregnancy and childbirth. Body changes during your first trimester Your body goes through many changes during pregnancy. The changes vary from woman to woman.  You may gain or lose a couple of pounds at first.  You may feel sick to your stomach (nauseous) and you may throw up (vomit). If the vomiting is uncontrollable, call your health care provider.  You may tire easily.  You may develop headaches that can  be relieved by medicines. All medicines should be approved by your health care provider.  You may urinate more often. Painful urination may mean you have a bladder infection.  You may develop heartburn as a result of your pregnancy.  You may develop constipation because certain hormones are causing the muscles that push stool through your intestines to slow down.  You may develop hemorrhoids or swollen veins (varicose veins).  Your breasts may begin to grow larger and become tender. Your nipples may stick out more, and the tissue that surrounds them (areola) may become darker.  Your gums may bleed and may be sensitive to brushing and flossing.  Dark spots or blotches (chloasma, mask of pregnancy) may develop on your face. This will likely fade after the baby is born.  Your menstrual periods will stop.  You may have a loss of appetite.  You may develop cravings for certain kinds of food.  You may have changes in your emotions from day to day, such as being excited to be pregnant or being concerned that something may go wrong with the pregnancy and baby.  You may have more vivid and strange dreams.  You may have changes in your hair. These can include thickening of your hair, rapid growth, and changes in texture. Some women also have hair loss during or after pregnancy, or hair that feels dry or thin. Your hair will most likely return to normal after your baby is born.  What to expect at prenatal visits During a routine prenatal visit:  You will be weighed to make sure you and the baby are growing normally.  Your blood pressure will be taken.  Your abdomen will be measured to track your baby's growth.  The fetal heartbeat will be listened to between weeks 10 and 14 of your pregnancy.  Test results from any previous visits will be discussed.  Your health care provider may ask you:  How you are feeling.  If you are feeling the baby move.  If you have had any  abnormal  symptoms, such as leaking fluid, bleeding, severe headaches, or abdominal cramping.  If you are using any tobacco products, including cigarettes, chewing tobacco, and electronic cigarettes.  If you have any questions.  Other tests that may be performed during your first trimester include:  Blood tests to find your blood type and to check for the presence of any previous infections. The tests will also be used to check for low iron levels (anemia) and protein on red blood cells (Rh antibodies). Depending on your risk factors, or if you previously had diabetes during pregnancy, you may have tests to check for high blood sugar that affects pregnant women (gestational diabetes).  Urine tests to check for infections, diabetes, or protein in the urine.  An ultrasound to confirm the proper growth and development of the baby.  Fetal screens for spinal cord problems (spina bifida) and Down syndrome.  HIV (human immunodeficiency virus) testing. Routine prenatal testing includes screening for HIV, unless you choose not to have this test.  You may need other tests to make sure you and the baby are doing well.  Follow these instructions at home: Medicines  Follow your health care provider's instructions regarding medicine use. Specific medicines may be either safe or unsafe to take during pregnancy.  Take a prenatal vitamin that contains at least 600 micrograms (mcg) of folic acid.  If you develop constipation, try taking a stool softener if your health care provider approves. Eating and drinking  Eat a balanced diet that includes fresh fruits and vegetables, whole grains, good sources of protein such as meat, eggs, or tofu, and low-fat dairy. Your health care provider will help you determine the amount of weight gain that is right for you.  Avoid raw meat and uncooked cheese. These carry germs that can cause birth defects in the baby.  Eating four or five small meals rather than three large  meals a day may help relieve nausea and vomiting. If you start to feel nauseous, eating a few soda crackers can be helpful. Drinking liquids between meals, instead of during meals, also seems to help ease nausea and vomiting.  Limit foods that are high in fat and processed sugars, such as fried and sweet foods.  To prevent constipation: ? Eat foods that are high in fiber, such as fresh fruits and vegetables, whole grains, and beans. ? Drink enough fluid to keep your urine clear or pale yellow. Activity  Exercise only as directed by your health care provider. Most women can continue their usual exercise routine during pregnancy. Try to exercise for 30 minutes at least 5 days a week. Exercising will help you: ? Control your weight. ? Stay in shape. ? Be prepared for labor and delivery.  Experiencing pain or cramping in the lower abdomen or lower back is a good sign that you should stop exercising. Check with your health care provider before continuing with normal exercises.  Try to avoid standing for long periods of time. Move your legs often if you must stand in one place for a long time.  Avoid heavy lifting.  Wear low-heeled shoes and practice good posture.  You may continue to have sex unless your health care provider tells you not to. Relieving pain and discomfort  Wear a good support bra to relieve breast tenderness.  Take warm sitz baths to soothe any pain or discomfort caused by hemorrhoids. Use hemorrhoid cream if your health care provider approves.  Rest with your legs elevated if you have leg   cramps or low back pain.  If you develop varicose veins in your legs, wear support hose. Elevate your feet for 15 minutes, 3-4 times a day. Limit salt in your diet. Prenatal care  Schedule your prenatal visits by the twelfth week of pregnancy. They are usually scheduled monthly at first, then more often in the last 2 months before delivery.  Write down your questions. Take them to  your prenatal visits.  Keep all your prenatal visits as told by your health care provider. This is important. Safety  Wear your seat belt at all times when driving.  Make a list of emergency phone numbers, including numbers for family, friends, the hospital, and police and fire departments. General instructions  Ask your health care provider for a referral to a local prenatal education class. Begin classes no later than the beginning of month 6 of your pregnancy.  Ask for help if you have counseling or nutritional needs during pregnancy. Your health care provider can offer advice or refer you to specialists for help with various needs.  Do not use hot tubs, steam rooms, or saunas.  Do not douche or use tampons or scented sanitary pads.  Do not cross your legs for long periods of time.  Avoid cat litter boxes and soil used by cats. These carry germs that can cause birth defects in the baby and possibly loss of the fetus by miscarriage or stillbirth.  Avoid all smoking, herbs, alcohol, and medicines not prescribed by your health care provider. Chemicals in these products affect the formation and growth of the baby.  Do not use any products that contain nicotine or tobacco, such as cigarettes and e-cigarettes. If you need help quitting, ask your health care provider. You may receive counseling support and other resources to help you quit.  Schedule a dentist appointment. At home, brush your teeth with a soft toothbrush and be gentle when you floss. Contact a health care provider if:  You have dizziness.  You have mild pelvic cramps, pelvic pressure, or nagging pain in the abdominal area.  You have persistent nausea, vomiting, or diarrhea.  You have a bad smelling vaginal discharge.  You have pain when you urinate.  You notice increased swelling in your face, hands, legs, or ankles.  You are exposed to fifth disease or chickenpox.  You are exposed to German measles (rubella)  and have never had it. Get help right away if:  You have a fever.  You are leaking fluid from your vagina.  You have spotting or bleeding from your vagina.  You have severe abdominal cramping or pain.  You have rapid weight gain or loss.  You vomit blood or material that looks like coffee grounds.  You develop a severe headache.  You have shortness of breath.  You have any kind of trauma, such as from a fall or a car accident. Summary  The first trimester of pregnancy is from week 1 until the end of week 13 (months 1 through 3).  Your body goes through many changes during pregnancy. The changes vary from woman to woman.  You will have routine prenatal visits. During those visits, your health care provider will examine you, discuss any test results you may have, and talk with you about how you are feeling. This information is not intended to replace advice given to you by your health care provider. Make sure you discuss any questions you have with your health care provider. Document Released: 01/10/2001 Document Revised: 12/29/2015 Document   Reviewed: 12/29/2015 Elsevier Interactive Patient Education  2018 [employer]. Commonly Asked Questions During Pregnancy  Cats: A parasite can be excreted in cat feces.  To avoid exposure you need to have another person empty the little box.  If you must empty the litter box you will need to wear gloves.  Wash your hands after handling your cat.  This parasite can also be found in raw or undercooked meat so this should also be avoided.  Colds, Sore Throats, Flu: Please check your medication sheet to see what you can take for symptoms.  If your symptoms are unrelieved by these medications please call the office.  Dental Work: Most any dental work [employer] recommends is permitted.  X-rays should only be taken during the first trimester if absolutely necessary.  Your abdomen should be shielded with a lead apron during all x-rays.  Please  notify your provider prior to receiving any x-rays.  Novocaine is fine; gas is not recommended.  If your dentist requires a note from [place] prior to dental work please call the office and we will provide one for you.  Exercise: Exercise is an important part of staying healthy during your pregnancy.  You may continue most exercises you were accustomed to prior to pregnancy.  Later in your pregnancy you will most likely notice you have difficulty with activities requiring balance like riding a bicycle.  It is important that you listen to your body and avoid activities that put you at a higher risk of falling.  Adequate rest and staying well hydrated are a must!  If you have questions about the safety of specific activities ask your provider.    Exposure to Children with illness: Try to avoid obvious exposure; report any symptoms to [place] when noted,  If you have chicken pos, red measles or mumps, you should be immune to these diseases.   Please do not take any vaccines while pregnant unless you have checked with your OB provider.  Fetal Movement: After 28 weeks we recommend you do "kick counts" twice daily.  Lie or sit down in a calm quiet environment and count your baby movements "kicks".  You should feel your baby at least 10 times per hour.  If you have not felt 10 kicks within the first hour get up, walk around and have something sweet to eat or drink then repeat for an additional hour.  If count remains less than 10 per hour notify your provider.  Fumigating: Follow your pest control agent's advice as to how long to stay out of your home.  Ventilate the area well before re-entering.  Hemorrhoids:   Most over-the-counter preparations can be used during pregnancy.  Check your medication to see what is safe to use.  It is important to use a stool softener or fiber in your diet and to drink lots of liquids.  If hemorrhoids seem to be getting worse please call the office.   Hot Tubs:  Hot tubs Jacuzzis and  saunas are not recommended while pregnant.  These increase your internal body temperature and should be avoided.  Intercourse:  Sexual intercourse is safe during pregnancy as long as you are comfortable, unless otherwise advised by your provider.  Spotting may occur after intercourse; report any bright red bleeding that is heavier than spotting.  Labor:  If you know that you are in labor, please go to the hospital.  If you are unsure, please call the office and let us help you decide what to  do.  Lifting, straining, etc:  If your job requires heavy lifting or straining please check with your provider for any limitations.  Generally, you should not lift items heavier than that you can lift simply with your hands and arms (no back muscles)  Painting:  Paint fumes do not harm your pregnancy, but may make you ill and should be avoided if possible.  Latex or water based paints have less odor than oils.  Use adequate ventilation while painting.  Permanents & Hair Color:  Chemicals in hair dyes are not recommended as they cause increase hair dryness which can increase hair loss during pregnancy.  " Highlighting" and permanents are allowed.  Dye may be absorbed differently and permanents may not hold as well during pregnancy.  Sunbathing:  Use a sunscreen, as skin burns easily during pregnancy.  Drink plenty of fluids; avoid over heating.  Tanning Beds:  Because their possible side effects are still unknown, tanning beds are not recommended.  Ultrasound Scans:  Routine ultrasounds are performed at approximately 20 weeks.  You will be able to see your baby's general anatomy an if you would like to know the gender this can usually be determined as well.  If it is questionable when you conceived you may also receive an ultrasound early in your pregnancy for dating purposes.  Otherwise ultrasound exams are not routinely performed unless there is a medical necessity.  Although you can request a scan we ask that  you pay for it when conducted because insurance does not cover " patient request" scans.  Work: If your pregnancy proceeds without complications you may work until your due date, unless your physician or employer advises otherwise.  Round Ligament Pain/Pelvic Discomfort:  Sharp, shooting pains not associated with bleeding are fairly common, usually occurring in the second trimester of pregnancy.  They tend to be worse when standing up or when you remain standing for long periods of time.  These are the result of pressure of certain pelvic ligaments called "round ligaments".  Rest, Tylenol and heat seem to be the most effective relief.  As the womb and fetus grow, they rise out of the pelvis and the discomfort improves.  Please notify the office if your pain seems different than that described.  It may represent a more serious condition.  Common Medications Safe in Pregnancy  Acne:      Constipation:  Benzoyl Peroxide     Colace  Clindamycin      Dulcolax Suppository  Topica Erythromycin     Fibercon  Salicylic Acid      Metamucil         Miralax AVOID:        Senakot   Accutane    Cough:  Retin-A       Cough Drops  Tetracycline      Phenergan w/ Codeine if Rx  Minocycline      Robitussin (Plain & DM)  Antibiotics:     Crabs/Lice:  Ceclor       RID  Cephalosporins    AVOID:  E-Mycins      Kwell  Keflex  Macrobid/Macrodantin   Diarrhea:  Penicillin      Kao-Pectate  Zithromax      Imodium AD         PUSH FLUIDS AVOID:       Cipro     Fever:  Tetracycline      Tylenol (Regular or Extra  Minocycline       Strength)  Levaquin      Extra Strength-Do not          Exceed 8 tabs/24 hrs Caffeine:        <200mg/day (equiv. To 1 cup of coffee or  approx. 3 12 oz sodas)         Gas: Cold/Hayfever:       Gas-X  Benadryl      Mylicon  Claritin       Phazyme  **Claritin-D        Chlor-Trimeton    Headaches:  Dimetapp      ASA-Free Excedrin  Drixoral-Non-Drowsy     Cold Compress  Mucinex  (Guaifenasin)     Tylenol (Regular or Extra  Sudafed/Sudafed-12 Hour     Strength)  **Sudafed PE Pseudoephedrine   Tylenol Cold & Sinus     Vicks Vapor Rub  Zyrtec  **AVOID if Problems With Blood Pressure         Heartburn: Avoid lying down for at least 1 hour after meals  Aciphex      Maalox     Rash:  Milk of Magnesia     Benadryl    Mylanta       1% Hydrocortisone Cream  Pepcid  Pepcid Complete   Sleep Aids:  Prevacid      Ambien   Prilosec       Benadryl  Rolaids       Chamomile Tea  Tums (Limit 4/day)     Unisom  Zantac       Tylenol PM         Warm milk-add vanilla or  Hemorrhoids:       Sugar for taste  Anusol/Anusol H.C.  (RX: Analapram 2.5%)  Sugar Substitutes:  Hydrocortisone OTC     Ok in moderation  Preparation H      Tucks        Vaseline lotion applied to tissue with wiping    Herpes:     Throat:  Acyclovir      Oragel  Famvir  Valtrex     Vaccines:         Flu Shot Leg Cramps:       *Gardasil  Benadryl      Hepatitis A         Hepatitis B Nasal Spray:       Pneumovax  Saline Nasal Spray     Polio Booster         Tetanus Nausea:       Tuberculosis test or PPD  Vitamin B6 25 mg TID   AVOID:    Dramamine      *Gardasil  Emetrol       Live Poliovirus  Ginger Root 250 mg QID    MMR (measles, mumps &  High Complex Carbs @ Bedtime    rebella)  Sea Bands-Accupressure    Varicella (Chickenpox)  Unisom 1/2 tab TID     *No known complications           If received before Pain:         Known pregnancy;   Darvocet       Resume series after  Lortab        Delivery  Percocet    Yeast:   Tramadol      Femstat  Tylenol 3      Gyne-lotrimin  Ultram       Monistat  Vicodin           MISC:           All Sunscreens           Hair Coloring/highlights          Insect Repellant's          (Including DEET)         Mystic Tans

## 2017-01-20 LAB — DRUG PROFILE, UR, 9 DRUGS (LABCORP)
Amphetamines, Urine: NEGATIVE ng/mL
BENZODIAZEPINE QUANT UR: NEGATIVE ng/mL
Barbiturate Quant, Ur: NEGATIVE ng/mL
CANNABINOID QUANT UR: NEGATIVE ng/mL
COCAINE (METAB.): NEGATIVE ng/mL
Methadone Screen, Urine: NEGATIVE ng/mL
OPIATE QUANT UR: NEGATIVE ng/mL
PCP QUANT UR: NEGATIVE ng/mL
Propoxyphene: NEGATIVE ng/mL

## 2017-01-20 LAB — URINALYSIS, ROUTINE W REFLEX MICROSCOPIC
Bilirubin, UA: NEGATIVE
Glucose, UA: NEGATIVE
LEUKOCYTES UA: NEGATIVE
Nitrite, UA: NEGATIVE
PH UA: 6.5 (ref 5.0–7.5)
Protein, UA: NEGATIVE
RBC, UA: NEGATIVE
Specific Gravity, UA: 1.023 (ref 1.005–1.030)
Urobilinogen, Ur: 0.2 mg/dL (ref 0.2–1.0)

## 2017-01-21 LAB — CULTURE, OB URINE

## 2017-01-21 LAB — URINE CULTURE, OB REFLEX

## 2017-01-22 LAB — GC/CHLAMYDIA PROBE AMP
CHLAMYDIA, DNA PROBE: NEGATIVE
NEISSERIA GONORRHOEAE BY PCR: NEGATIVE

## 2017-02-06 ENCOUNTER — Encounter: Payer: Self-pay | Admitting: Obstetrics and Gynecology

## 2017-02-07 ENCOUNTER — Encounter: Payer: Self-pay | Admitting: Obstetrics and Gynecology

## 2017-02-07 ENCOUNTER — Ambulatory Visit: Payer: Self-pay | Admitting: Obstetrics and Gynecology

## 2017-02-07 VITALS — BP 104/70 | HR 75 | Wt 166.4 lb

## 2017-02-07 DIAGNOSIS — Z3482 Encounter for supervision of other normal pregnancy, second trimester: Secondary | ICD-10-CM

## 2017-02-07 LAB — POCT URINALYSIS DIPSTICK
BILIRUBIN UA: NEGATIVE
GLUCOSE UA: NEGATIVE
Ketones, UA: NEGATIVE
LEUKOCYTES UA: NEGATIVE
Nitrite, UA: NEGATIVE
Protein, UA: NEGATIVE
RBC UA: NEGATIVE
Spec Grav, UA: 1.02 (ref 1.010–1.025)
Urobilinogen, UA: 0.2 E.U./dL
pH, UA: 5 (ref 5.0–8.0)

## 2017-02-07 NOTE — Progress Notes (Signed)
NOB:  Patient tolerating liquids but little else.  Has undergone significant weight loss.  Has felt better the last few days.  No ketones in urine. Tolerating gummy vitamins.  Physical examination General NAD, Conversant  HEENT Atraumatic; Op clear with mmm.  Normo-cephalic. Pupils reactive. Anicteric sclerae  Thyroid/Neck Smooth without nodularity or enlargement. Normal ROM.  Neck Supple.  Skin No rashes, lesions or ulceration. Normal palpated skin turgor. No nodularity.  Breasts: No masses or discharge.  Symmetric.  No axillary adenopathy.  Lungs: Clear to auscultation.No rales or wheezes. Normal Respiratory effort, no retractions.  Heart: NSR.  No murmurs or rubs appreciated. No periferal edema  Abdomen: Soft.  Non-tender.  No masses.  No HSM. No hernia  Extremities: Moves all appropriately.  Normal ROM for age. No lymphadenopathy.  Neuro: Oriented to PPT.  Normal mood. Normal affect.     Pelvic:   Vulva: Normal appearance.  No lesions.  Vagina: No lesions or abnormalities noted.  Support: Normal pelvic support.  Urethra No masses tenderness or scarring.  Meatus Normal size without lesions or prolapse.  Cervix: Normal appearance.  No lesions.  Anus: Normal exam.  No lesions.  Perineum: Normal exam.  No lesions.        Bimanual   Adnexae: No masses.  Non-tender to palpation.  Uterus: Enlarged. 13weeks POS FHT's  Non-tender.  Mobile.  AV.  Adnexae: No masses.  Non-tender to palpation.  Cul-de-sac: Negative for abnormality.  Adnexae: No masses.  Non-tender to palpation.         Pelvimetry   Diagonal: Reached.  Spines: Average.  Sacrum: Concave.  Pubic Arch: Normal.   Assessment: Beginning second trimester   Nausea and vomiting improving.    Plan: Prenatal labs today Pap GC chlamydia performed Discussed genetic testing in detail.  Patient considering testing will inform us at next visit regarding MaterniT 21 quad screen or AFP. Discussed nausea vomiting of pregnancy in  detail multiple strategies discussed.  Frequent small meals bland foods and Ensure. Follow-up 4 weeks

## 2017-02-08 LAB — RUBELLA SCREEN: RUBELLA: 1.86 {index} (ref >0.99)

## 2017-02-08 LAB — CBC WITH DIFFERENTIAL
BASOS ABS: 0 10*3/uL (ref 0.0–0.2)
Basos: 0 %
EOS (ABSOLUTE): 0.1 10*3/uL (ref 0.0–0.4)
Eos: 1 %
Hematocrit: 37.9 % (ref 34.0–46.6)
Hemoglobin: 13.3 g/dL (ref 11.1–15.9)
IMMATURE GRANS (ABS): 0 10*3/uL (ref 0.0–0.1)
Immature Granulocytes: 0 %
LYMPHS: 18 %
Lymphocytes Absolute: 1.8 10*3/uL (ref 0.7–3.1)
MCH: 29.2 pg (ref 26.6–33.0)
MCHC: 35.1 g/dL (ref 31.5–35.7)
MCV: 83 fL (ref 79–97)
MONOS ABS: 0.5 10*3/uL (ref 0.1–0.9)
Monocytes: 5 %
NEUTROS PCT: 76 %
Neutrophils Absolute: 7.6 10*3/uL — ABNORMAL HIGH (ref 1.4–7.0)
RBC: 4.56 x10E6/uL (ref 3.77–5.28)
RDW: 14.7 % (ref 12.3–15.4)
WBC: 9.9 10*3/uL (ref 3.4–10.8)

## 2017-02-08 LAB — SICKLE CELL SCREEN: Sickle Cell Screen: NEGATIVE

## 2017-02-08 LAB — URINALYSIS, ROUTINE W REFLEX MICROSCOPIC
BILIRUBIN UA: NEGATIVE
Glucose, UA: NEGATIVE
Ketones, UA: NEGATIVE
LEUKOCYTES UA: NEGATIVE
Nitrite, UA: NEGATIVE
PH UA: 6.5 (ref 5.0–7.5)
Protein, UA: NEGATIVE
RBC UA: NEGATIVE
Specific Gravity, UA: 1.024 (ref 1.005–1.030)
UUROB: 1 mg/dL (ref 0.2–1.0)

## 2017-02-08 LAB — ABO AND RH: RH TYPE: POSITIVE

## 2017-02-08 LAB — VARICELLA ZOSTER ANTIBODY, IGG: VARICELLA: 1978 {index} (ref >165)

## 2017-02-08 LAB — ANTIBODY SCREEN: ANTIBODY SCREEN: NEGATIVE

## 2017-02-08 LAB — HIV ANTIBODY (ROUTINE TESTING W REFLEX): HIV Screen 4th Generation wRfx: NONREACTIVE

## 2017-02-08 LAB — RPR: RPR: NONREACTIVE

## 2017-02-08 LAB — HEPATITIS B SURFACE ANTIGEN: HEP B S AG: NEGATIVE

## 2017-02-09 LAB — NICOTINE SCREEN, URINE: Cotinine Ql Scrn, Ur: NEGATIVE ng/mL

## 2017-02-09 LAB — MONITOR DRUG PROFILE 14(MW)
AMPHETAMINE SCREEN URINE: NEGATIVE ng/mL
BARBITURATE SCREEN URINE: NEGATIVE ng/mL
BENZODIAZEPINE SCREEN, URINE: NEGATIVE ng/mL
Buprenorphine, Urine: NEGATIVE ng/mL
CANNABINOIDS UR QL SCN: NEGATIVE ng/mL
Cocaine (Metab) Scrn, Ur: NEGATIVE ng/mL
Creatinine(Crt), U: 166.3 mg/dL (ref 20.0–300.0)
FENTANYL, URINE: NEGATIVE pg/mL
MEPERIDINE SCREEN, URINE: NEGATIVE ng/mL
Methadone Screen, Urine: NEGATIVE ng/mL
OPIATE SCREEN URINE: NEGATIVE ng/mL
OXYCODONE+OXYMORPHONE UR QL SCN: NEGATIVE ng/mL
PROPOXYPHENE SCREEN URINE: NEGATIVE ng/mL
Ph of Urine: 6.2 (ref 4.5–8.9)
Phencyclidine Qn, Ur: NEGATIVE ng/mL
SPECIFIC GRAVITY: 1.027
TRAMADOL SCREEN, URINE: NEGATIVE ng/mL

## 2017-02-09 LAB — URINE CULTURE

## 2017-02-10 LAB — PAP IG, CT-NG, RFX HPV ASCU
CHLAMYDIA, NUC. ACID AMP: NEGATIVE
GONOCOCCUS BY NUCLEIC ACID AMP: NEGATIVE
PAP SMEAR COMMENT: 0

## 2017-02-13 ENCOUNTER — Telehealth: Payer: Self-pay

## 2017-02-13 NOTE — Telephone Encounter (Signed)
-----   Message from Linzie Collinavid James Evans, MD sent at 02/13/2017  8:51 AM EST ----- Negative Pap and HPV

## 2017-02-13 NOTE — Telephone Encounter (Signed)
Voicemail message left- neg test results per provider.

## 2017-02-16 ENCOUNTER — Telehealth: Payer: Self-pay | Admitting: Obstetrics and Gynecology

## 2017-02-16 ENCOUNTER — Telehealth: Payer: Self-pay

## 2017-02-16 NOTE — Telephone Encounter (Signed)
Pt informed

## 2017-02-16 NOTE — Telephone Encounter (Signed)
Patient called and is inquiring about her test results. Patient is requesting a call back. Her contact # is [phone]. Please advise. Thank you

## 2017-03-07 ENCOUNTER — Ambulatory Visit (INDEPENDENT_AMBULATORY_CARE_PROVIDER_SITE_OTHER): Payer: Self-pay | Admitting: Obstetrics and Gynecology

## 2017-03-07 VITALS — BP 104/69 | HR 89 | Wt 167.9 lb

## 2017-03-07 DIAGNOSIS — E669 Obesity, unspecified: Secondary | ICD-10-CM

## 2017-03-07 DIAGNOSIS — Z3480 Encounter for supervision of other normal pregnancy, unspecified trimester: Secondary | ICD-10-CM

## 2017-03-07 DIAGNOSIS — O2612 Low weight gain in pregnancy, second trimester: Secondary | ICD-10-CM

## 2017-03-07 LAB — POCT URINALYSIS DIPSTICK
BILIRUBIN UA: NEGATIVE
Blood, UA: NEGATIVE
GLUCOSE UA: NEGATIVE
KETONES UA: 80
Nitrite, UA: NEGATIVE
PH UA: 6 (ref 5.0–8.0)
Protein, UA: NEGATIVE
Spec Grav, UA: 1.02 (ref 1.010–1.025)
UROBILINOGEN UA: 0.2 U/dL

## 2017-03-07 NOTE — Patient Instructions (Signed)
Second Trimester of Pregnancy The second trimester is from week 13 through week 28, month 4 through 6. This is often the time in pregnancy that you feel your best. Often times, morning sickness has lessened or quit. You may have more energy, and you may get hungry more often. Your unborn baby (fetus) is growing rapidly. At the end of the sixth month, he or she is about 9 inches long and weighs about 1 pounds. You will likely feel the baby move (quickening) between 18 and 20 weeks of pregnancy. Follow these instructions at home:  Avoid all smoking, herbs, and alcohol. Avoid drugs not approved by your doctor.  Do not use any tobacco products, including cigarettes, chewing tobacco, and electronic cigarettes. If you need help quitting, ask your doctor. You may get counseling or other support to help you quit.  Only take medicine as told by your doctor. Some medicines are safe and some are not during pregnancy.  Exercise only as told by your doctor. Stop exercising if you start having cramps.  Eat regular, healthy meals.  Wear a good support bra if your breasts are tender.  Do not use hot tubs, steam rooms, or saunas.  Wear your seat belt when driving.  Avoid raw meat, uncooked cheese, and liter boxes and soil used by cats.  Take your prenatal vitamins.  Take 1500-2000 milligrams of calcium daily starting at the 20th week of pregnancy until you deliver your baby.  Try taking medicine that helps you poop (stool softener) as needed, and if your doctor approves. Eat more fiber by eating fresh fruit, vegetables, and whole grains. Drink enough fluids to keep your pee (urine) clear or pale yellow.  Take warm water baths (sitz baths) to soothe pain or discomfort caused by hemorrhoids. Use hemorrhoid cream if your doctor approves.  If you have puffy, bulging veins (varicose veins), wear support hose. Raise (elevate) your feet for 15 minutes, 3-4 times a day. Limit salt in your diet.  Avoid heavy  lifting, wear low heals, and sit up straight.  Rest with your legs raised if you have leg cramps or low back pain.  Visit your dentist if you have not gone during your pregnancy. Use a soft toothbrush to brush your teeth. Be gentle when you floss.  You can have sex (intercourse) unless your doctor tells you not to.  Go to your doctor visits. Get help if:  You feel dizzy.  You have mild cramps or pressure in your lower belly (abdomen).  You have a nagging pain in your belly area.  You continue to feel sick to your stomach (nauseous), throw up (vomit), or have watery poop (diarrhea).  You have bad smelling fluid coming from your vagina.  You have pain with peeing (urination). Get help right away if:  You have a fever.  You are leaking fluid from your vagina.  You have spotting or bleeding from your vagina.  You have severe belly cramping or pain.  You lose or gain weight rapidly.  You have trouble catching your breath and have chest pain.  You notice sudden or extreme puffiness (swelling) of your face, hands, ankles, feet, or legs.  You have not felt the baby move in over an hour.  You have severe headaches that do not go away with medicine.  You have vision changes. This information is not intended to replace advice given to you by your health care provider. Make sure you discuss any questions you have with your health care   provider. Document Released: 04/12/2009 Document Revised: 06/24/2015 Document Reviewed: 03/19/2012 Elsevier Interactive Patient Education  2017 Elsevier Inc.  

## 2017-03-07 NOTE — Progress Notes (Signed)
PT is doing well no concerns.

## 2017-03-07 NOTE — Progress Notes (Signed)
ROB: Tolerating some foods now, mostly fruits and veggies. Also drinking nutritional supplements. Declines any further medication for nausea/vomiting as things are improving.  All general questions regarding pregnancy answered. Discussed genetic testing, patient still waiting on insurance as she is currently still self pay, but does desire.  For anatomy scan in 3 weeks, can have testing done then.  RTC in 4 weeks for OB appt.

## 2017-03-15 ENCOUNTER — Telehealth: Payer: Self-pay | Admitting: Obstetrics and Gynecology

## 2017-03-15 NOTE — Telephone Encounter (Signed)
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 The patient would like to speak with a nurse. Please advise.

## 2017-03-15 NOTE — Telephone Encounter (Signed)
Spoke with pt and asked her what type of work restrictions did she need and wrote her out a work restrictions pt will pick up tomorrow.

## 2017-03-28 ENCOUNTER — Ambulatory Visit (INDEPENDENT_AMBULATORY_CARE_PROVIDER_SITE_OTHER): Payer: Self-pay

## 2017-03-28 DIAGNOSIS — Z3480 Encounter for supervision of other normal pregnancy, unspecified trimester: Secondary | ICD-10-CM

## 2017-04-04 ENCOUNTER — Ambulatory Visit (INDEPENDENT_AMBULATORY_CARE_PROVIDER_SITE_OTHER): Payer: Self-pay | Admitting: Obstetrics and Gynecology

## 2017-04-04 ENCOUNTER — Encounter: Payer: Self-pay | Admitting: Obstetrics and Gynecology

## 2017-04-04 VITALS — BP 96/67 | HR 86 | Wt 174.0 lb

## 2017-04-04 DIAGNOSIS — Z3492 Encounter for supervision of normal pregnancy, unspecified, second trimester: Secondary | ICD-10-CM

## 2017-04-04 DIAGNOSIS — O2342 Unspecified infection of urinary tract in pregnancy, second trimester: Secondary | ICD-10-CM

## 2017-04-04 LAB — POCT URINALYSIS DIPSTICK
Bilirubin, UA: NEGATIVE
GLUCOSE UA: NEGATIVE
Ketones, UA: 5
Nitrite, UA: POSITIVE
Protein, UA: NEGATIVE
RBC UA: NEGATIVE
Spec Grav, UA: 1.015 (ref 1.010–1.025)
Urobilinogen, UA: 0.2 E.U./dL
pH, UA: 6 (ref 5.0–8.0)

## 2017-04-04 MED ORDER — NITROFURANTOIN MONOHYD MACRO 100 MG PO CAPS: 100.0000 mg | ORAL_CAPSULE | Freq: Two times a day (BID) | ORAL | 1 refills | Status: DC

## 2017-04-04 NOTE — Progress Notes (Signed)
ROB: Patient asymptomatic but urine consistent with UTI.  Will treat with Macrobid.  We have again discussed genetic testing and she has declined at this time.  Nausea and vomiting resolved.

## 2017-04-04 NOTE — Progress Notes (Signed)
ROB- pt is doing well, she has a UTI from UA done today- no sx

## 2017-04-04 NOTE — Addendum Note (Signed)
Addended by: Elonda HuskyEVANS, DAVID J on: 04/04/2017 02:53 PM   Modules accepted: Orders

## 2017-04-05 LAB — URINE CULTURE

## 2017-04-12 NOTE — Progress Notes (Signed)
done

## 2017-05-02 ENCOUNTER — Ambulatory Visit (INDEPENDENT_AMBULATORY_CARE_PROVIDER_SITE_OTHER): Payer: Self-pay | Admitting: Obstetrics and Gynecology

## 2017-05-02 VITALS — BP 108/75 | HR 87 | Wt 176.6 lb

## 2017-05-02 DIAGNOSIS — Z131 Encounter for screening for diabetes mellitus: Secondary | ICD-10-CM

## 2017-05-02 DIAGNOSIS — Z113 Encounter for screening for infections with a predominantly sexual mode of transmission: Secondary | ICD-10-CM

## 2017-05-02 DIAGNOSIS — O26842 Uterine size-date discrepancy, second trimester: Secondary | ICD-10-CM

## 2017-05-02 DIAGNOSIS — Z3482 Encounter for supervision of other normal pregnancy, second trimester: Secondary | ICD-10-CM

## 2017-05-02 DIAGNOSIS — Z130 Encounter for screening for diseases of the blood and blood-forming organs and certain disorders involving the immune mechanism: Secondary | ICD-10-CM

## 2017-05-02 LAB — POCT URINALYSIS DIPSTICK
Bilirubin, UA: NEGATIVE
GLUCOSE UA: NEGATIVE
Ketones, UA: 15
LEUKOCYTES UA: NEGATIVE
Nitrite, UA: NEGATIVE
Protein, UA: NEGATIVE
RBC UA: NEGATIVE
Spec Grav, UA: 1.015 (ref 1.010–1.025)
Urobilinogen, UA: 0.2 E.U./dL
pH, UA: 7 (ref 5.0–8.0)

## 2017-05-02 NOTE — Progress Notes (Signed)
ROB pt is doing well no concerns.  

## 2017-05-02 NOTE — Progress Notes (Signed)
ROB: Patient doing well, no complaints.  RTC in 3-4 weeks, for 28 weeks labs.  Size>dates today, if still noted on next exam, will f/u with ultrasound. Initially with weight loss during pregnancy but now gaining appropriately.

## 2017-06-05 ENCOUNTER — Other Ambulatory Visit: Payer: Self-pay

## 2017-06-05 ENCOUNTER — Other Ambulatory Visit: Payer: Self-pay | Admitting: Obstetrics and Gynecology

## 2017-06-05 ENCOUNTER — Encounter: Payer: Self-pay | Admitting: Obstetrics and Gynecology

## 2017-06-05 ENCOUNTER — Ambulatory Visit (INDEPENDENT_AMBULATORY_CARE_PROVIDER_SITE_OTHER): Payer: Self-pay | Admitting: Obstetrics and Gynecology

## 2017-06-05 VITALS — BP 111/73 | HR 78 | Wt 181.1 lb

## 2017-06-05 DIAGNOSIS — Z3483 Encounter for supervision of other normal pregnancy, third trimester: Secondary | ICD-10-CM

## 2017-06-05 DIAGNOSIS — Z23 Encounter for immunization: Secondary | ICD-10-CM

## 2017-06-05 DIAGNOSIS — O26843 Uterine size-date discrepancy, third trimester: Secondary | ICD-10-CM

## 2017-06-05 DIAGNOSIS — Z130 Encounter for screening for diseases of the blood and blood-forming organs and certain disorders involving the immune mechanism: Secondary | ICD-10-CM

## 2017-06-05 DIAGNOSIS — Z131 Encounter for screening for diabetes mellitus: Secondary | ICD-10-CM

## 2017-06-05 DIAGNOSIS — Z113 Encounter for screening for infections with a predominantly sexual mode of transmission: Secondary | ICD-10-CM

## 2017-06-05 LAB — POCT URINALYSIS DIPSTICK
Bilirubin, UA: NEGATIVE
Blood, UA: NEGATIVE
Glucose, UA: NEGATIVE
KETONES UA: NEGATIVE
Leukocytes, UA: NEGATIVE
NITRITE UA: NEGATIVE
ODOR: NEGATIVE
PH UA: 7.5 (ref 5.0–8.0)
PROTEIN UA: NEGATIVE
Spec Grav, UA: 1.01 (ref 1.010–1.025)
UROBILINOGEN UA: 0.2 U/dL

## 2017-06-05 MED ORDER — TETANUS-DIPHTH-ACELL PERTUSSIS 5-2.5-18.5 LF-MCG/0.5 IM SUSP
0.5000 mL | Freq: Once | INTRAMUSCULAR | Status: AC
  Administered 2017-06-05: 0.5 mL via INTRAMUSCULAR

## 2017-06-05 NOTE — Progress Notes (Signed)
ROB- gtt, tdap and btc- done.

## 2017-06-05 NOTE — Progress Notes (Signed)
ROB: Patient without complaint.  Doing her 1 hour GCT today.  Size remains greater than dates.  (Possibly maternal body habitus) ultrasound ordered at 32 weeks for size greater than dates.

## 2017-06-06 LAB — CBC
HEMATOCRIT: 32.1 % — AB (ref 34.0–46.6)
Hemoglobin: 10.8 g/dL — ABNORMAL LOW (ref 11.1–15.9)
MCH: 29.5 pg (ref 26.6–33.0)
MCHC: 33.6 g/dL (ref 31.5–35.7)
MCV: 88 fL (ref 79–97)
PLATELETS: 233 10*3/uL (ref 150–379)
RBC: 3.66 x10E6/uL — ABNORMAL LOW (ref 3.77–5.28)
RDW: 13.4 % (ref 12.3–15.4)
WBC: 11 10*3/uL — ABNORMAL HIGH (ref 3.4–10.8)

## 2017-06-06 LAB — GLUCOSE, 1 HOUR GESTATIONAL: Gestational Diabetes Screen: 154 mg/dL — ABNORMAL HIGH (ref 65–139)

## 2017-06-06 LAB — RPR: RPR Ser Ql: NONREACTIVE

## 2017-06-14 ENCOUNTER — Other Ambulatory Visit: Payer: Self-pay

## 2017-06-14 DIAGNOSIS — Z3483 Encounter for supervision of other normal pregnancy, third trimester: Secondary | ICD-10-CM

## 2017-06-15 LAB — CBC
HEMOGLOBIN: 11.1 g/dL (ref 11.1–15.9)
Hematocrit: 33.3 % — ABNORMAL LOW (ref 34.0–46.6)
MCH: 29.5 pg (ref 26.6–33.0)
MCHC: 33.3 g/dL (ref 31.5–35.7)
MCV: 89 fL (ref 79–97)
PLATELETS: 240 10*3/uL (ref 150–379)
RBC: 3.76 x10E6/uL — AB (ref 3.77–5.28)
RDW: 13.9 % (ref 12.3–15.4)
WBC: 11 10*3/uL — AB (ref 3.4–10.8)

## 2017-06-15 LAB — RPR: RPR Ser Ql: NONREACTIVE

## 2017-06-15 LAB — GESTATIONAL GLUCOSE TOLERANCE
GLUCOSE 2 HOUR GTT: 182 mg/dL — AB (ref 65–154)
Glucose, Fasting: 94 mg/dL (ref 65–94)
Glucose, GTT - 1 Hour: 257 mg/dL — ABNORMAL HIGH (ref 65–179)
Glucose, GTT - 3 Hour: 98 mg/dL (ref 65–139)

## 2017-06-18 ENCOUNTER — Telehealth: Payer: Self-pay

## 2017-06-18 ENCOUNTER — Telehealth: Payer: Self-pay | Admitting: Obstetrics and Gynecology

## 2017-06-18 DIAGNOSIS — O24419 Gestational diabetes mellitus in pregnancy, unspecified control: Secondary | ICD-10-CM

## 2017-06-18 NOTE — Telephone Encounter (Signed)
The patient called and stated that she has a few more questions for Crystal. 1. The patient would like to know if she will have gestational diabetes until she gives birth? 2. If she already has a meter, Will she have to buy a new one to use? No other information was disclosed, other than wanting a call back. Please advise.

## 2017-06-18 NOTE — Telephone Encounter (Signed)
-----   Message from Linzie Collin, MD sent at 06/18/2017  9:31 AM EDT ----- Jeb Schloemer:  This is an abnormal GTT.  She needs to be scheduled to begin glucose monitoring and diabetic education.

## 2017-06-18 NOTE — Telephone Encounter (Signed)
Questions answered.

## 2017-06-18 NOTE — Telephone Encounter (Signed)
Pt aware. Lifestyle referral placed.

## 2017-06-19 ENCOUNTER — Ambulatory Visit (INDEPENDENT_AMBULATORY_CARE_PROVIDER_SITE_OTHER): Payer: Self-pay

## 2017-06-19 ENCOUNTER — Ambulatory Visit (INDEPENDENT_AMBULATORY_CARE_PROVIDER_SITE_OTHER): Payer: Self-pay | Admitting: Obstetrics and Gynecology

## 2017-06-19 VITALS — BP 95/64 | HR 81 | Wt 180.2 lb

## 2017-06-19 DIAGNOSIS — Z3493 Encounter for supervision of normal pregnancy, unspecified, third trimester: Secondary | ICD-10-CM

## 2017-06-19 DIAGNOSIS — O99210 Obesity complicating pregnancy, unspecified trimester: Secondary | ICD-10-CM

## 2017-06-19 DIAGNOSIS — O99810 Abnormal glucose complicating pregnancy: Secondary | ICD-10-CM

## 2017-06-19 DIAGNOSIS — O26843 Uterine size-date discrepancy, third trimester: Secondary | ICD-10-CM

## 2017-06-19 LAB — POCT URINALYSIS DIPSTICK
Bilirubin, UA: NEGATIVE
Blood, UA: NEGATIVE
GLUCOSE UA: NEGATIVE
Ketones, UA: 40
LEUKOCYTES UA: NEGATIVE
Nitrite, UA: NEGATIVE
Protein, UA: NEGATIVE
SPEC GRAV UA: 1.015 (ref 1.010–1.025)
Urobilinogen, UA: 0.2 E.U./dL
pH, UA: 6.5 (ref 5.0–8.0)

## 2017-06-19 NOTE — Progress Notes (Signed)
ROB: Notes Braxton Hicks ctx, but otherwise doing well. Had abnormal 3 hr GTT, pending referral to Lifestyles. Desires to breastfeed.  Unsure of contraceptive desires. Given handout.  S/p normal growth scan today (performed for S>D), growth at 52%ile.  RTC in 2 weeks.

## 2017-06-19 NOTE — Progress Notes (Signed)
ROB-pt stated that she has been having braxton hicks contractions but they are not regular. Pt is doing well no other concerns.

## 2017-06-20 ENCOUNTER — Encounter: Payer: Self-pay | Attending: Obstetrics and Gynecology | Admitting: Dietician

## 2017-06-20 ENCOUNTER — Encounter: Payer: Self-pay | Admitting: Dietician

## 2017-06-20 VITALS — Ht 59.5 in | Wt 180.8 lb

## 2017-06-20 DIAGNOSIS — Z713 Dietary counseling and surveillance: Secondary | ICD-10-CM | POA: Insufficient documentation

## 2017-06-20 DIAGNOSIS — O24410 Gestational diabetes mellitus in pregnancy, diet controlled: Secondary | ICD-10-CM

## 2017-06-20 DIAGNOSIS — O24419 Gestational diabetes mellitus in pregnancy, unspecified control: Secondary | ICD-10-CM | POA: Insufficient documentation

## 2017-06-20 NOTE — Patient Instructions (Addendum)
   OK to have a nutrition drink for breakfast if you are not hungry; but make sure it's low sugar -- Glucerna, Boos Glucose control, Premier Protein, [employer] Essentials (low sugar), EAS or Atkins shakes.   Choose 1 starchy food per meal, about 1/2 - 1 cup. This includes potatoes, pasta, macaroni, corn, bread (2 slices).   If you eat pinto beans, have 1 cup and that counts as your starch and some protein. It's OK to add a small amount of cheese with the beans, but no other starchy foods.

## 2017-06-20 NOTE — Progress Notes (Signed)
Medical Nutrition Therapy: Visit start time: 1310  end time: 1430  Assessment:  Diagnosis: gestational diabetes Past medical history: none significant Psychosocial issues/ stress concerns: none Preferred learning method:  . Auditory   Current weight: 180.1lbs Height: 4'11.5" Medications, supplements: pre-natal vitamins, iron supplement  Progress and evaluation: Patient reports weight loss during first trimester due to nausea and vomiting; now improved but sometimes does not want to eat breakfast. She had been drinking large amounts of apple juice, which she has now stopped. Patient voices some food dislikes such as eggs, large meat portions, multiple vegetables, some fruits. She has a meter to begin testing BGs, but has not yet started. Grandmother has diabetes.    Physical activity: on-the-job walking, up to 20,000 steps daily per patient  Dietary Intake:  Usual eating pattern includes 3 meals and 2 snacks per day. Dining out frequency: 1 meals per week.  Breakfast: Special K with almonds; occ frosted flakes (can't eat eggs ) jelly biscuit, cheese croissant. Indigestion with breakfast meats, orange juice Snack: none Lunch: today small portion spaghetti, no bread; 5/21 mac and cheese, potatoes and gravy; can beanie weenies, mac and cheese Snack: crackers stopped chips since pregnant Supper: tacos, spaghetti, pinto beans with squash, corn, potato salad; likes chicken not much steak or pork. Likes salad with lettuce and cucumbers Snack: sometimes wakes up hungry about 2am, eats pkg crackers with peanut butter or cream cheese Beverages: water, sugar free lemonade, Crystal light. Stopped apple juice since DM diagnosis Loves peanut butter  Nutrition Care Education: Topics covered: gestational diabetes Basic nutrition: basic food groups, appropriate nutrient balance, appropriate meal and snack schedule, general nutrition guidelines    Diabetes:  goals for BGs, frequency of BG testing,  appropriate meal and snack schedule, appropriate carb intake and balance, basic plate method meal planning; balanced meal options  Nutritional Diagnosis:  [place]-2.2 Altered nutrition-related laboratory As related to gestational diabetes.  As evidenced by glucose tolerance test.  Intervention: Instruction as noted above.   Meal planning concepts are somewhat overwhelming for patient; she will work to control starch portions and include protein with meals, and add a vegetable or fruit when able.    Provided sample menus and instructed on making substitutions when needed.    Set goals with input from patient.  Education Materials given:  . General diet guidelines for Diabetes . Plate Planner . Sample menus . Goals/ instructions . blood glucose record  Food Safety  Preventing Diabetes  Learner/ who was taught:  . Patient   Level of understanding: . Partial understanding; needs review/ practice  Demonstrated degree of understanding via:   Teach back Learning barriers: . None  Willingness to learn/ readiness for change: . Eager, change in progress  Monitoring and Evaluation:  Dietary intake, exercise, BG control, and body weight      follow up: 07/05/17

## 2017-07-03 ENCOUNTER — Encounter: Payer: Self-pay | Admitting: Obstetrics and Gynecology

## 2017-07-05 ENCOUNTER — Ambulatory Visit: Payer: Self-pay | Admitting: Dietician

## 2017-07-09 ENCOUNTER — Encounter: Payer: Self-pay | Admitting: Obstetrics and Gynecology

## 2017-07-25 ENCOUNTER — Ambulatory Visit: Payer: Self-pay | Admitting: Obstetrics and Gynecology

## 2017-07-25 ENCOUNTER — Encounter: Payer: Self-pay | Admitting: Obstetrics and Gynecology

## 2017-07-25 VITALS — BP 129/80 | HR 87 | Wt 182.1 lb

## 2017-07-25 DIAGNOSIS — O99210 Obesity complicating pregnancy, unspecified trimester: Secondary | ICD-10-CM

## 2017-07-25 DIAGNOSIS — O24419 Gestational diabetes mellitus in pregnancy, unspecified control: Secondary | ICD-10-CM

## 2017-07-25 DIAGNOSIS — Z3483 Encounter for supervision of other normal pregnancy, third trimester: Secondary | ICD-10-CM

## 2017-07-25 LAB — POCT URINALYSIS DIPSTICK
BILIRUBIN UA: NEGATIVE
GLUCOSE UA: NEGATIVE
Ketones, UA: NEGATIVE
LEUKOCYTES UA: NEGATIVE
Nitrite, UA: NEGATIVE
Protein, UA: NEGATIVE
RBC UA: NEGATIVE
SPEC GRAV UA: 1.01 (ref 1.010–1.025)
Urobilinogen, UA: 0.2 E.U./dL
pH, UA: 6 (ref 5.0–8.0)

## 2017-07-25 NOTE — Progress Notes (Signed)
ROB: No complaints.  Denies contractions.  Signs and symptoms of labor reviewed.  GC/CT-GBS performed.  Begin NSTs Monday.  Sugars good.  Delivery by approximately 40 weeks.

## 2017-07-25 NOTE — Progress Notes (Signed)
ROB-pt stated that she is doing well no concerns.  

## 2017-07-27 ENCOUNTER — Encounter: Payer: Self-pay | Admitting: Obstetrics and Gynecology

## 2017-07-27 LAB — STREP GP B NAA: Strep Gp B NAA: NEGATIVE

## 2017-07-27 LAB — GC/CHLAMYDIA PROBE AMP
Chlamydia trachomatis, NAA: NEGATIVE
Neisseria gonorrhoeae by PCR: NEGATIVE

## 2017-08-01 ENCOUNTER — Ambulatory Visit (INDEPENDENT_AMBULATORY_CARE_PROVIDER_SITE_OTHER): Payer: Medicaid Other | Admitting: Obstetrics and Gynecology

## 2017-08-01 ENCOUNTER — Other Ambulatory Visit: Payer: Self-pay

## 2017-08-01 VITALS — BP 102/69 | HR 84 | Wt 180.4 lb

## 2017-08-01 DIAGNOSIS — Z3A38 38 weeks gestation of pregnancy: Secondary | ICD-10-CM | POA: Diagnosis not present

## 2017-08-01 DIAGNOSIS — O24410 Gestational diabetes mellitus in pregnancy, diet controlled: Secondary | ICD-10-CM

## 2017-08-01 DIAGNOSIS — Z3483 Encounter for supervision of other normal pregnancy, third trimester: Secondary | ICD-10-CM

## 2017-08-01 DIAGNOSIS — Z1389 Encounter for screening for other disorder: Secondary | ICD-10-CM | POA: Diagnosis not present

## 2017-08-01 DIAGNOSIS — Z331 Pregnant state, incidental: Secondary | ICD-10-CM | POA: Diagnosis not present

## 2017-08-01 HISTORY — DX: Gestational diabetes mellitus in pregnancy, diet controlled: O24.410

## 2017-08-01 LAB — POCT URINALYSIS DIPSTICK
Bilirubin, UA: NEGATIVE
Glucose, UA: NEGATIVE
KETONES UA: NEGATIVE
Leukocytes, UA: NEGATIVE
NITRITE UA: NEGATIVE
PH UA: 6.5 (ref 5.0–8.0)
Protein, UA: POSITIVE — AB
RBC UA: NEGATIVE
Spec Grav, UA: 1.015 (ref 1.010–1.025)
UROBILINOGEN UA: 0.2 U/dL

## 2017-08-01 NOTE — Progress Notes (Signed)
ROB: Patient complained of "knots in her stomach" earlier today, was sent home from work. Denies complaints now. No contractions present on NST. Notes good blood sugars.  GBS neg.  NST performed today was reviewed and was found to be reactive.  Continue recommended antenatal testing and prenatal care. To schedule for induction next week.     NONSTRESS TEST INTERPRETATION  INDICATIONS: GDM (diet controlled) FHR baseline: 150 bpm RESULTS:Reactive COMMENTS: No contractions   PLAN: 1. Continue fetal kick counts twice a day. 2. Continue antepartum testing as scheduled-weekly

## 2017-08-01 NOTE — Progress Notes (Signed)
Pt stated that she is doing well no complaints.  

## 2017-08-08 ENCOUNTER — Other Ambulatory Visit: Payer: Self-pay

## 2017-08-08 ENCOUNTER — Encounter: Payer: Self-pay | Admitting: Obstetrics and Gynecology

## 2017-08-08 ENCOUNTER — Ambulatory Visit: Payer: Self-pay | Admitting: Obstetrics and Gynecology

## 2017-08-08 VITALS — BP 111/74 | HR 82 | Wt 184.5 lb

## 2017-08-08 DIAGNOSIS — Z3A39 39 weeks gestation of pregnancy: Secondary | ICD-10-CM

## 2017-08-08 DIAGNOSIS — Z3483 Encounter for supervision of other normal pregnancy, third trimester: Secondary | ICD-10-CM

## 2017-08-08 DIAGNOSIS — O24410 Gestational diabetes mellitus in pregnancy, diet controlled: Secondary | ICD-10-CM

## 2017-08-08 LAB — POCT URINALYSIS DIPSTICK
BILIRUBIN UA: NEGATIVE
GLUCOSE UA: NEGATIVE
Ketones, UA: NEGATIVE
LEUKOCYTES UA: NEGATIVE
Nitrite, UA: NEGATIVE
Protein, UA: POSITIVE — AB
RBC UA: NEGATIVE
Spec Grav, UA: 1.01 (ref 1.010–1.025)
Urobilinogen, UA: 0.2 E.U./dL
pH, UA: 7.5 (ref 5.0–8.0)

## 2017-08-08 NOTE — Progress Notes (Signed)
ROB-pt stated that she was doing well no complaints.  

## 2017-08-08 NOTE — Progress Notes (Signed)
ROB: Denies contractions.  Reports daily fetal movement.  Dates none of her sugars have been above 120.  NST today reactive.  Induction scheduled for 7/17  Induction discussed in detail.  NONSTRESS TEST INTERPRETATION  INDICATIONS: Gestational diabetes diet-controlled FHR baseline: 130 RESULTS:  reactive COMMENTS:   PLAN: 1. Continue fetal kick counts as directed. 2. Continue antepartum testing as scheduled.  [name] J. Evans, M.D. 08/08/2017 3:23 PM

## 2017-08-15 ENCOUNTER — Other Ambulatory Visit: Payer: Self-pay

## 2017-08-15 ENCOUNTER — Inpatient Hospital Stay: Payer: Medicaid Other | Admitting: Anesthesiology

## 2017-08-15 ENCOUNTER — Inpatient Hospital Stay
Admission: EM | Admit: 2017-08-15 | Discharge: 2017-08-17 | DRG: 788 | Disposition: A | Discharge status: Home / Self Care | Payer: Medicaid Other | Attending: Obstetrics and Gynecology | Admitting: Obstetrics and Gynecology

## 2017-08-15 DIAGNOSIS — O324XX0 Maternal care for high head at term, not applicable or unspecified: Secondary | ICD-10-CM | POA: Diagnosis present

## 2017-08-15 DIAGNOSIS — Z3490 Encounter for supervision of normal pregnancy, unspecified, unspecified trimester: Secondary | ICD-10-CM | POA: Diagnosis present

## 2017-08-15 DIAGNOSIS — O99214 Obesity complicating childbirth: Secondary | ICD-10-CM | POA: Diagnosis present

## 2017-08-15 DIAGNOSIS — E669 Obesity, unspecified: Secondary | ICD-10-CM | POA: Diagnosis present

## 2017-08-15 DIAGNOSIS — Z3A40 40 weeks gestation of pregnancy: Secondary | ICD-10-CM | POA: Diagnosis not present

## 2017-08-15 DIAGNOSIS — O24420 Gestational diabetes mellitus in childbirth, diet controlled: Secondary | ICD-10-CM | POA: Diagnosis present

## 2017-08-15 DIAGNOSIS — O480 Post-term pregnancy: Secondary | ICD-10-CM | POA: Diagnosis not present

## 2017-08-15 LAB — CBC
HEMATOCRIT: 32.3 % — AB (ref 35.0–47.0)
HEMOGLOBIN: 11.1 g/dL — AB (ref 12.0–16.0)
MCH: 29.1 pg (ref 26.0–34.0)
MCHC: 34.4 g/dL (ref 32.0–36.0)
MCV: 84.5 fL (ref 80.0–100.0)
Platelets: 257 10*3/uL (ref 150–440)
RBC: 3.82 MIL/uL (ref 3.80–5.20)
RDW: 14.9 % — AB (ref 11.5–14.5)
WBC: 10.3 10*3/uL (ref 3.6–11.0)

## 2017-08-15 LAB — TYPE AND SCREEN
ABO/RH(D): AB POS
Antibody Screen: NEGATIVE

## 2017-08-15 LAB — GLUCOSE, CAPILLARY: Glucose-Capillary: 99 mg/dL (ref 70–99)

## 2017-08-15 MED ORDER — SOD CITRATE-CITRIC ACID 500-334 MG/5ML PO SOLN
30.0000 mL | ORAL | Status: DC | PRN
Start: 2017-08-15 — End: 2017-08-16
  Administered 2017-08-16: 30 mL via ORAL
  Filled 2017-08-15 (×2): qty 15

## 2017-08-15 MED ORDER — BUPIVACAINE HCL (PF) 0.25 % IJ SOLN
INTRAMUSCULAR | Status: DC | PRN
  Administered 2017-08-15: 4 mL via EPIDURAL
  Administered 2017-08-15: 5 mL via EPIDURAL

## 2017-08-15 MED ORDER — MISOPROSTOL 50MCG HALF TABLET
ORAL_TABLET | ORAL | Status: AC
  Administered 2017-08-15: 50 ug
  Filled 2017-08-15: qty 1

## 2017-08-15 MED ORDER — EPHEDRINE 5 MG/ML INJ
10.0000 mg | INTRAVENOUS | Status: DC | PRN
  Administered 2017-08-15: 15 mg via INTRAVENOUS

## 2017-08-15 MED ORDER — ACETAMINOPHEN 325 MG PO TABS: 650.0000 mg | ORAL_TABLET | ORAL | Status: DC | PRN

## 2017-08-15 MED ORDER — PHENYLEPHRINE 40 MCG/ML (10ML) SYRINGE FOR IV PUSH (FOR BLOOD PRESSURE SUPPORT): 80.0000 ug | PREFILLED_SYRINGE | INTRAVENOUS | Status: DC | PRN

## 2017-08-15 MED ORDER — OXYTOCIN 40 UNITS IN LACTATED RINGERS INFUSION - SIMPLE MED
2.5000 [IU]/h | INTRAVENOUS | Status: DC
  Administered 2017-08-16: 2.5 [IU]/h via INTRAVENOUS
  Filled 2017-08-15: qty 1000

## 2017-08-15 MED ORDER — FENTANYL 2.5 MCG/ML W/ROPIVACAINE 0.15% IN NS 100 ML EPIDURAL (ARMC)
EPIDURAL | Status: AC
  Filled 2017-08-15: qty 100

## 2017-08-15 MED ORDER — ONDANSETRON HCL 4 MG/2ML IJ SOLN: 4.0000 mg | Freq: Four times a day (QID) | INTRAMUSCULAR | Status: DC | PRN

## 2017-08-15 MED ORDER — LACTATED RINGERS IV SOLN
500.0000 mL | Freq: Once | INTRAVENOUS | Status: AC
  Administered 2017-08-15: 500 mL via INTRAVENOUS

## 2017-08-15 MED ORDER — DIPHENHYDRAMINE HCL 50 MG/ML IJ SOLN: 12.5000 mg | INTRAMUSCULAR | Status: DC | PRN

## 2017-08-15 MED ORDER — OXYTOCIN 40 UNITS IN LACTATED RINGERS INFUSION - SIMPLE MED
INTRAVENOUS | Status: AC
  Filled 2017-08-15: qty 1000

## 2017-08-15 MED ORDER — EPHEDRINE 5 MG/ML INJ: 10.0000 mg | INTRAVENOUS | Status: DC | PRN

## 2017-08-15 MED ORDER — FENTANYL 2.5 MCG/ML W/ROPIVACAINE 0.15% IN NS 100 ML EPIDURAL (ARMC)
12.0000 mL/h | EPIDURAL | Status: DC
  Administered 2017-08-15: 12 mL/h via EPIDURAL

## 2017-08-15 MED ORDER — OXYTOCIN BOLUS FROM INFUSION: 500.0000 mL | Freq: Once | INTRAVENOUS | Status: DC

## 2017-08-15 MED ORDER — LACTATED RINGERS IV SOLN
INTRAVENOUS | Status: DC
  Administered 2017-08-15: via INTRAVENOUS

## 2017-08-15 MED ORDER — LACTATED RINGERS IV SOLN
500.0000 mL | INTRAVENOUS | Status: DC | PRN
  Administered 2017-08-15: 1000 mL via INTRAVENOUS

## 2017-08-15 MED ORDER — LIDOCAINE HCL (PF) 1 % IJ SOLN
30.0000 mL | INTRAMUSCULAR | Status: AC | PRN
  Administered 2017-08-15: 3 mL via SUBCUTANEOUS

## 2017-08-15 MED ORDER — LIDOCAINE-EPINEPHRINE (PF) 1.5 %-1:200000 IJ SOLN
INTRAMUSCULAR | Status: DC | PRN
  Administered 2017-08-15: 4 mL via EPIDURAL

## 2017-08-15 MED ORDER — EPHEDRINE 5 MG/ML INJ
INTRAVENOUS | Status: AC
  Filled 2017-08-15: qty 4

## 2017-08-15 MED ORDER — LACTATED RINGERS IV SOLN
Freq: Once | INTRAVENOUS | Status: AC
  Administered 2017-08-15: 09:00:00 via INTRAVENOUS

## 2017-08-15 MED ORDER — BUTORPHANOL TARTRATE 2 MG/ML IJ SOLN
1.0000 mg | INTRAMUSCULAR | Status: DC | PRN
  Administered 2017-08-15 (×3): 1 mg via INTRAVENOUS
  Filled 2017-08-15 (×2): qty 1

## 2017-08-15 NOTE — Progress Notes (Signed)
@DJELOGO @  History and Physical   HPI  Barbara Huff is a 32 y.o. G3P0020 at [redacted]w[redacted]d Estimated Date of Delivery: 08/14/17 who is being admitted for  induction of labor, for gestation diabetes.   OB History  OB History  Gravida Para Term Preterm AB Living  3 0 0 0 2 0  SAB TAB Ectopic Multiple Live Births  1 0 1 0 0    # Outcome Date GA Lbr Len/2nd Weight Sex Delivery Anes PTL Lv  3 Current           2 SAB 12/2014          1 Ectopic             PROBLEM LIST  Pregnancy complications or risks: Patient Active Problem List   Diagnosis Date Noted  ? Encounter for induction of labor 08/15/2017  ? Diet controlled gestational diabetes mellitus (GDM) in third trimester 08/01/2017  ? 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 Obesity (BMI 30.0-34.9) 12/27/2016  ? Acute pyelonephritis 06/24/2015  ? Status post laparoscopy 06/17/2015  ? Unruptured tubal pregnancy 06/10/2015    Prenatal labs and studies: ABO, Rh: AB/Positive/-- 02-14-2022 1531) Antibody: Negative 14-Feb-2022 1531) Rubella: 1.86 Feb 14, 2022 1531) RPR: Non Reactive (05/16 1106)  HBsAg: Negative 2022-02-14 1531)  HIV: Non Reactive February 14, 2022 1531)  [id number] (06/26 1632)   Past Medical History:  Diagnosis Date  ? SAB (spontaneous abortion)      Past Surgical History:  Procedure Laterality Date  ? DIAGNOSTIC LAPAROSCOPY WITH REMOVAL OF ECTOPIC PREGNANCY Left 06/10/2015   Procedure: DIAGNOSTIC LAPAROSCOPY WITH REMOVAL OF ECTOPIC PREGNANCY;  Surgeon: Herold Harms, MD;  Location: [facility] ORS;  Service: Gynecology;  Laterality: Left;  ? LAPAROSCOPIC LYSIS OF ADHESIONS N/A 06/10/2015   Procedure: LAPAROSCOPIC LYSIS OF ADHESIONS;  Surgeon: Herold Harms, MD;  Location: [facility] ORS;  Service: Gynecology;  Laterality: N/A;     Medications    Current Discharge Medication List    CONTINUE these medications which have NOT CHANGED   Details  ferrous sulfate 325 (65 FE) MG tablet Take 325 mg by mouth daily with breakfast.     Prenatal Vit-Fe Fumarate-FA (PRENATAL MULTIVITAMIN) TABS tablet Take 1 tablet by mouth daily at 12 noon.         Allergies  Codeine  Review of Systems  Pertinent items are noted in HPI.  Physical Exam  BP 120/81 (BP Location: Left Arm)   Pulse 75   Temp 97.8 F (36.6 C) (Oral)   Resp 18   Ht 4' 11.5" (1.511 m)   Wt 184 lb (83.5 kg)   LMP 11/07/2016 (Exact Date)   BMI 36.54 kg/m   Lungs:  CTA B Cardio: RRR without M/R/G Abd: Soft, gravid, NT Presentation: cephalic EXT: No C/C/ 1+ Edema DTRs: 2+ B CERVIX:  Dilation: Closed Effacement (%): Thick Station: -3, -2 Exam by:: Dr. Logan Bores   See Prenatal records for more detailed PE.     FHR:  Variability: Good {> 6 bpm)  Toco: Uterine Contractions: None   Test Results  Results for orders placed or performed during the hospital encounter of 08/15/17 (from the past 24 hour(s))  Glucose, capillary     Status: None   Collection Time: 08/15/17  8:29 AM  Result Value Ref Range   Glucose-Capillary 99 70 - 99 mg/dL   Group B Strep negative  Assessment   G3P0020 at [redacted]w[redacted]d Estimated Date of Delivery: 08/14/17  The fetus is reassuring.  50 mcg Misoprostol placed  Patient  Active Problem List   Diagnosis Date Noted  ? Encounter for induction of labor 08/15/2017  ? Diet controlled gestational diabetes mellitus (GDM) in third trimester 08/01/2017  ? 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 Obesity (BMI 30.0-34.9) 12/27/2016  ? Acute pyelonephritis 06/24/2015  ? Status post laparoscopy 06/17/2015  ? Unruptured tubal pregnancy 06/10/2015    Plan  1. Admit to L&D :    2. EFM: -- Category 1 3. Epidural if desired.  Stadol for IV pain until epidural requested. 4. Admission labs  5.   [name] J. Tanielle Emigh, M.D. 08/15/2017 9:12 AM

## 2017-08-15 NOTE — Progress Notes (Signed)
LABOR NOTE   Barbara Huff 32 y.o.GP@ at 7169w1d Not in labor.  SUBJECTIVE:  comfortable. not in labor OBJECTIVE:  BP 119/88 (BP Location: Left Arm)   Pulse 68   Temp 98 F (36.7 C) (Oral)   Resp 18   Ht 4' 11.5" (1.511 m)   Wt 184 lb (83.5 kg)   LMP 11/07/2016 (Exact Date)   BMI 36.54 kg/m  No intake/output data recorded.  She has not shown cervical change. CERVIX: <0.5 cm:  Long:   -3:   posterior:   firm SVE:    Dilation: Closed Effacement (%): Thick Station: -3, -2 Exam by:: Evans MD CONTRACTIONS: rare irreg FHR: Fetal heart tracing reviewed. Variability: Good {> 6 bpm) Category I  50 mcg Miso placed Analgesia: Labor support without medications  Labs: Lab Results  Component Value Date   WBC 10.3 08/15/2017   HGB 11.1 (L) 08/15/2017   HCT 32.3 (L) 08/15/2017   MCV 84.5 08/15/2017   PLT 257 08/15/2017    ASSESSMENT: 1) Labor curve reviewed.       Progress: Not in labor.     Membranes: intact           2)     Active Problems:   Encounter for induction of labor  PLAN: none   [name] J. Evans, M.D. 08/15/2017 1:44 PM

## 2017-08-15 NOTE — Anesthesia Procedure Notes (Signed)
Epidural Patient location during procedure: OB  Staffing Performed: anesthesiologist   Preanesthetic Checklist Completed: patient identified, site marked, surgical consent, pre-op evaluation, timeout performed, IV checked, risks and benefits discussed and monitors and equipment checked  Epidural Patient position: sitting Prep: Betadine Patient monitoring: heart rate, continuous pulse ox and blood pressure Approach: midline Location: L4-L5 Injection technique: LOR air  Needle:  Needle type: Tuohy  Needle gauge: 17 G Needle length: 9 cm and 9 Needle insertion depth: 6 cm Catheter type: closed end flexible Catheter size: 19 Gauge Catheter at skin depth: 12 cm Test dose: negative and 1.5% lidocaine with Epi 1:200 K  Assessment Sensory level: T10 Events: blood not aspirated, injection not painful, no injection resistance, negative IV test and no paresthesia  Additional Notes Pt's history reviewed and consent obtained as per OB consent Patient tolerated the insertion well without complications. Negative SATD, negative IVTD All VSS were obtained and monitored through OBIX and nursing protocols followed.Reason for block:procedure for pain

## 2017-08-15 NOTE — Progress Notes (Signed)
Pt ambulated in hallway from [phone]. Requested pt to return to room d/t FHR not tracing in hallway. US adjusted.

## 2017-08-15 NOTE — Progress Notes (Signed)
Patient arrived to LDR 5 for scheduled induction of labor. Patient reports last good fetal movement at this time. Denies leaking of fluid, vaginal bleeding or contractions. EFM applied and assessing.

## 2017-08-15 NOTE — Anesthesia Preprocedure Evaluation (Signed)
Anesthesia Evaluation  Patient identified by MRN, date of birth, ID band Patient awake    Reviewed: Allergy & Precautions, H&P , NPO status , Patient's Chart, lab work & pertinent test results, reviewed documented beta blocker date and time   Airway Mallampati: II  TM Distance: >3 FB Neck ROM: full    Dental no notable dental hx. (+) Teeth Intact   Pulmonary neg pulmonary ROS, Current Smoker,    Pulmonary exam normal breath sounds clear to auscultation       Cardiovascular Exercise Tolerance: Good negative cardio ROS   Rhythm:regular Rate:Normal     Neuro/Psych negative neurological ROS  negative psych ROS   GI/Hepatic negative GI ROS, Neg liver ROS,   Endo/Other  negative endocrine ROSdiabetes, Well Controlled, Gestational  Renal/GU Renal disease     Musculoskeletal   Abdominal   Peds  Hematology negative hematology ROS (+)   Anesthesia Other Findings   Reproductive/Obstetrics (+) Pregnancy                             Anesthesia Physical Anesthesia Plan  ASA: II  Anesthesia Plan: Epidural   Post-op Pain Management:    Induction:   PONV Risk Score and Plan:   Airway Management Planned:   Additional Equipment:   Intra-op Plan:   Post-operative Plan:   Informed Consent: I have reviewed the patients History and Physical, chart, labs and discussed the procedure including the risks, benefits and alternatives for the proposed anesthesia with the patient or authorized representative who has indicated his/her understanding and acceptance.     Plan Discussed with:   Anesthesia Plan Comments:         Anesthesia Quick Evaluation

## 2017-08-16 ENCOUNTER — Encounter
Admission: EM | Disposition: A | Discharge status: Home / Self Care | Payer: Self-pay | Source: Home / Self Care | Attending: Obstetrics and Gynecology

## 2017-08-16 DIAGNOSIS — O480 Post-term pregnancy: Secondary | ICD-10-CM

## 2017-08-16 DIAGNOSIS — Z3A40 40 weeks gestation of pregnancy: Secondary | ICD-10-CM

## 2017-08-16 LAB — RPR: RPR Ser Ql: NONREACTIVE

## 2017-08-16 LAB — GLUCOSE, CAPILLARY: Glucose-Capillary: 116 mg/dL — ABNORMAL HIGH (ref 70–99)

## 2017-08-16 SURGERY — Surgical Case
Anesthesia: Epidural | Site: Abdomen | Wound class: Clean Contaminated

## 2017-08-16 MED ORDER — CEFAZOLIN SODIUM-DEXTROSE 2-3 GM-%(50ML) IV SOLR
INTRAVENOUS | Status: DC | PRN
  Administered 2017-08-16: 2 g via INTRAVENOUS

## 2017-08-16 MED ORDER — ACETAMINOPHEN 325 MG PO TABS: 650.0000 mg | ORAL_TABLET | ORAL | Status: DC | PRN

## 2017-08-16 MED ORDER — DIPHENHYDRAMINE HCL 25 MG PO CAPS: 25.0000 mg | ORAL_CAPSULE | Freq: Four times a day (QID) | ORAL | Status: DC | PRN

## 2017-08-16 MED ORDER — OXYTOCIN 40 UNITS IN LACTATED RINGERS INFUSION - SIMPLE MED
2.5000 [IU]/h | INTRAVENOUS | Status: AC
  Administered 2017-08-16: 2.5 [IU]/h via INTRAVENOUS
  Filled 2017-08-16: qty 1000

## 2017-08-16 MED ORDER — ACETAMINOPHEN 500 MG PO TABS
1000.0000 mg | ORAL_TABLET | Freq: Four times a day (QID) | ORAL | Status: DC
  Administered 2017-08-16: 1000 mg via ORAL
  Filled 2017-08-16: qty 2

## 2017-08-16 MED ORDER — ACETAMINOPHEN 500 MG PO TABS: 1000.0000 mg | ORAL_TABLET | Freq: Four times a day (QID) | ORAL | Status: DC

## 2017-08-16 MED ORDER — DIPHENHYDRAMINE HCL 50 MG/ML IJ SOLN: 12.5000 mg | INTRAMUSCULAR | Status: DC | PRN

## 2017-08-16 MED ORDER — SIMETHICONE 80 MG PO CHEW
80.0000 mg | CHEWABLE_TABLET | Freq: Four times a day (QID) | ORAL | Status: DC
  Administered 2017-08-16 – 2017-08-17 (×5): 80 mg via ORAL
  Filled 2017-08-16 (×5): qty 1

## 2017-08-16 MED ORDER — NALBUPHINE HCL 10 MG/ML IJ SOLN: 5.0000 mg | INTRAMUSCULAR | Status: DC | PRN

## 2017-08-16 MED ORDER — PHENYLEPHRINE 40 MCG/ML (10ML) SYRINGE FOR IV PUSH (FOR BLOOD PRESSURE SUPPORT)
PREFILLED_SYRINGE | INTRAVENOUS | Status: DC | PRN
  Administered 2017-08-16 (×4): 100 ug via INTRAVENOUS

## 2017-08-16 MED ORDER — SODIUM CHLORIDE 0.9% FLUSH: 3.0000 mL | INTRAVENOUS | Status: DC | PRN

## 2017-08-16 MED ORDER — IBUPROFEN 600 MG PO TABS
600.0000 mg | ORAL_TABLET | Freq: Four times a day (QID) | ORAL | Status: DC
  Administered 2017-08-16 – 2017-08-17 (×5): 600 mg via ORAL
  Filled 2017-08-16 (×5): qty 1

## 2017-08-16 MED ORDER — FENTANYL CITRATE (PF) 100 MCG/2ML IJ SOLN
25.0000 ug | INTRAMUSCULAR | Status: DC | PRN
  Administered 2017-08-16 (×4): 25 ug via INTRAVENOUS
  Filled 2017-08-16: qty 2

## 2017-08-16 MED ORDER — NALOXONE HCL 0.4 MG/ML IJ SOLN: 0.4000 mg | INTRAMUSCULAR | Status: DC | PRN

## 2017-08-16 MED ORDER — MORPHINE SULFATE (PF) 0.5 MG/ML IJ SOLN
INTRAMUSCULAR | Status: AC
  Filled 2017-08-16: qty 10

## 2017-08-16 MED ORDER — MENTHOL 3 MG MT LOZG
1.0000 | LOZENGE | OROMUCOSAL | Status: DC | PRN
  Filled 2017-08-16: qty 9

## 2017-08-16 MED ORDER — PHENYLEPHRINE HCL 10 MG/ML IJ SOLN
INTRAMUSCULAR | Status: DC | PRN
  Administered 2017-08-16: 30 ug/min via INTRAVENOUS

## 2017-08-16 MED ORDER — TERBUTALINE SULFATE 1 MG/ML IJ SOLN
INTRAMUSCULAR | Status: AC
  Administered 2017-08-16: 1 mg
  Filled 2017-08-16: qty 1

## 2017-08-16 MED ORDER — KETOROLAC TROMETHAMINE 30 MG/ML IJ SOLN: 30.0000 mg | Freq: Four times a day (QID) | INTRAMUSCULAR | Status: DC | PRN

## 2017-08-16 MED ORDER — ONDANSETRON HCL 4 MG/2ML IJ SOLN: 4.0000 mg | Freq: Three times a day (TID) | INTRAMUSCULAR | Status: DC | PRN

## 2017-08-16 MED ORDER — MORPHINE SULFATE-NACL 0.5-0.9 MG/ML-% IV SOSY
PREFILLED_SYRINGE | INTRAVENOUS | Status: DC | PRN
  Administered 2017-08-16: 2 mg via EPIDURAL

## 2017-08-16 MED ORDER — PROPOFOL 10 MG/ML IV BOLUS
INTRAVENOUS | Status: AC
Start: 2017-08-16 — End: ?
  Filled 2017-08-16: qty 20

## 2017-08-16 MED ORDER — MEPERIDINE HCL 25 MG/ML IJ SOLN: 6.2500 mg | INTRAMUSCULAR | Status: DC | PRN

## 2017-08-16 MED ORDER — LIDOCAINE 5 % EX PTCH
MEDICATED_PATCH | CUTANEOUS | Status: DC | PRN
  Administered 2017-08-16: 1 via TRANSDERMAL

## 2017-08-16 MED ORDER — DIPHENHYDRAMINE HCL 25 MG PO CAPS: 25.0000 mg | ORAL_CAPSULE | ORAL | Status: DC | PRN

## 2017-08-16 MED ORDER — LACTATED RINGERS IV SOLN: INTRAVENOUS | Status: DC

## 2017-08-16 MED ORDER — ONDANSETRON HCL 4 MG/2ML IJ SOLN
INTRAMUSCULAR | Status: DC | PRN
  Administered 2017-08-16: 4 mg via INTRAVENOUS

## 2017-08-16 MED ORDER — SENNOSIDES-DOCUSATE SODIUM 8.6-50 MG PO TABS
2.0000 | ORAL_TABLET | ORAL | Status: DC
  Administered 2017-08-17: 2 via ORAL
  Filled 2017-08-16 (×2): qty 2

## 2017-08-16 MED ORDER — ZOLPIDEM TARTRATE 5 MG PO TABS: 5.0000 mg | ORAL_TABLET | Freq: Every evening | ORAL | Status: DC | PRN

## 2017-08-16 MED ORDER — ONDANSETRON HCL 4 MG/2ML IJ SOLN: 4.0000 mg | Freq: Once | INTRAMUSCULAR | Status: DC | PRN

## 2017-08-16 MED ORDER — LIDOCAINE 5 % EX PTCH
MEDICATED_PATCH | CUTANEOUS | Status: AC
  Filled 2017-08-16: qty 1

## 2017-08-16 MED ORDER — CEFAZOLIN SODIUM-DEXTROSE 2-4 GM/100ML-% IV SOLN
2.0000 g | INTRAVENOUS | Status: DC
  Filled 2017-08-16: qty 100

## 2017-08-16 MED ORDER — PRENATAL MULTIVITAMIN CH
1.0000 | ORAL_TABLET | Freq: Every day | ORAL | Status: DC
  Administered 2017-08-16: 1 via ORAL
  Filled 2017-08-16: qty 1

## 2017-08-16 MED ORDER — OXYCODONE-ACETAMINOPHEN 5-325 MG PO TABS
2.0000 | ORAL_TABLET | ORAL | Status: DC | PRN
  Administered 2017-08-16: 2 via ORAL
  Filled 2017-08-16: qty 2

## 2017-08-16 MED ORDER — OXYTOCIN 40 UNITS IN LACTATED RINGERS INFUSION - SIMPLE MED
INTRAVENOUS | Status: DC | PRN
  Administered 2017-08-16: 299 mL via INTRAVENOUS
  Administered 2017-08-16: 1 mL via INTRAVENOUS

## 2017-08-16 MED ORDER — SOD CITRATE-CITRIC ACID 500-334 MG/5ML PO SOLN
30.0000 mL | ORAL | Status: AC
  Administered 2017-08-16: 30 mL via ORAL

## 2017-08-16 MED ORDER — CHLOROPROCAINE HCL (PF) 3 % IJ SOLN
INTRAMUSCULAR | Status: DC | PRN
  Administered 2017-08-16: 10 mL
  Administered 2017-08-16: 4 mL

## 2017-08-16 MED ORDER — OXYCODONE-ACETAMINOPHEN 5-325 MG PO TABS: 1.0000 | ORAL_TABLET | ORAL | Status: DC | PRN

## 2017-08-16 SURGICAL SUPPLY — 26 items
ADHESIVE MASTISOL STRL (MISCELLANEOUS) IMPLANT
BAG COUNTER SPONGE EZ (MISCELLANEOUS) ×4 IMPLANT
BENZOIN TINCTURE PRP APPL 2/3 (GAUZE/BANDAGES/DRESSINGS) ×3 IMPLANT
CANISTER SUCT 3000ML PPV (MISCELLANEOUS) ×3 IMPLANT
CHLORAPREP W/TINT 26ML (MISCELLANEOUS) ×6 IMPLANT
CLOSURE WOUND 1/2 X4 (GAUZE/BANDAGES/DRESSINGS) ×1
COUNTER SPONGE BAG EZ (MISCELLANEOUS) ×2
DRSG TELFA 3X8 NADH (GAUZE/BANDAGES/DRESSINGS) ×3 IMPLANT
GAUZE SPONGE 4X4 12PLY STRL (GAUZE/BANDAGES/DRESSINGS) ×3 IMPLANT
GLOVE BIOGEL PI ORTHO PRO 7.5 (GLOVE) ×2
GLOVE PI ORTHO PRO STRL 7.5 (GLOVE) ×1 IMPLANT
GOWN STRL REUS W/ TWL LRG LVL3 (GOWN DISPOSABLE) ×3 IMPLANT
GOWN STRL REUS W/TWL LRG LVL3 (GOWN DISPOSABLE) ×6
KIT TURNOVER KIT A (KITS) ×3 IMPLANT
NS IRRIG 1000ML POUR BTL (IV SOLUTION) ×3 IMPLANT
PACK C SECTION AR (MISCELLANEOUS) ×3 IMPLANT
PAD OB MATERNITY 4.3X12.25 (PERSONAL CARE ITEMS) ×6 IMPLANT
PAD PREP 24X41 OB/GYN DISP (PERSONAL CARE ITEMS) ×3 IMPLANT
RETRACTOR WND ALEXIS-O 25 LRG (MISCELLANEOUS) ×1 IMPLANT
RTRCTR WOUND ALEXIS O 25CM LRG (MISCELLANEOUS) ×3
SPONGE LAP 18X18 RF (DISPOSABLE) IMPLANT
STRIP CLOSURE SKIN 1/2X4 (GAUZE/BANDAGES/DRESSINGS) ×2 IMPLANT
SUT VIC AB 0 CTX 36 (SUTURE) ×4
SUT VIC AB 0 CTX36XBRD ANBCTRL (SUTURE) ×2 IMPLANT
SUT VIC AB 1 CT1 36 (SUTURE) ×6 IMPLANT
SUT VICRYL+ 3-0 36IN CT-1 (SUTURE) ×9 IMPLANT

## 2017-08-16 NOTE — Lactation Note (Signed)
This note was copied from a baby's chart. Lactation Consultation Note  Patient Name: Barbara Myles GipMaria Huff Today's Date: 08/16/2017 Reason for consult: Initial assessment   Maternal Data    Feeding Feeding Type: Breast Fed  LATCH Score Latch: Repeated attempts needed to sustain latch, nipple held in mouth throughout feeding, stimulation needed to elicit sucking reflex.  Audible Swallowing: None  Type of Nipple: Everted at rest and after stimulation  Comfort (Breast/Nipple): Soft / non-tender  Hold (Positioning): Assistance needed to correctly position infant at breast and maintain latch.  LATCH Score: 6  Interventions Interventions: Breast feeding basics reviewed;Assisted with latch;Adjust position;Support pillows  Lactation Tools Discussed/Used     Consult Status  Mother is skin to skin with infant and attempting to breast feed infant. LC assisted with latch and positioning using the cross cradle hold. Infant successfully latched and mother educated on feeding cues, cluster feeding and basic breastfeeding.    Barbara Huff 08/16/2017, 12:35 PM

## 2017-08-16 NOTE — Lactation Note (Signed)
This note was copied from a baby's chart. Lactation Consultation Note  Patient Name: Girl Myles GipMaria Empey ZOXWR'UToday's Date: 08/16/2017 Reason for consult: Follow-up assessment   Maternal Data    Feeding Feeding Type: Breast Fed(Attempted) Length of feed: (expressed colostrum on finger and fed to infant)  LATCH Score Latch: Too sleepy or reluctant, no latch achieved, no sucking elicited.  Audible Swallowing: None  Type of Nipple: Everted at rest and after stimulation  Comfort (Breast/Nipple): Soft / non-tender  Hold (Positioning): Assistance needed to correctly position infant at breast and maintain latch.  LATCH Score: 5  Interventions Interventions: Skin to skin  Lactation Tools Discussed/Used     Consult Status Consult Status: PRN Mother called LC to room to assist with latch. Infant attempted to latch but was very sleepy. A few drops of colostrum was expressed and fed to infant and infant was placed skin to skin. LC reviewed feeding cues with mother and benefits of skin to skin.   Arlyss Gandylicia Delmy Holdren 08/16/2017, 5:07 PM

## 2017-08-16 NOTE — Op Note (Signed)
      OP NOTE  Date: 08/16/2017   1:50 AM Name Barbara Huff MR# [id number]  Preoperative Diagnosis: 1. Intrauterine pregnancy at 28102w2d Active Problems:   Encounter for induction of labor  2.  non-reassuring fetal status remote from labor, meconium  Postoperative Diagnosis: 1. Intrauterine pregnancy at 36102w2d, delivered 2. Viable infant 3. Remainder same as pre-op 4. Head high in pelvis c/w CPD   Procedure: 1. Primary Low-Transverse Cesarean Section  Surgeon: [name] J. Mostafa Yuan, MD  Assistant:    Anesthesia: Epidural    EBL: 500  ml     Findings: 1) female infant, Apgar scores of 8    at 1 minute and 8    at 5 minutes and a birthweight of 109.7  ounces.    2) Normal uterus, tubes and ovaries.    Procedure:  The patient was prepped and draped in the supine position and placed under spinal anesthesia.  A transverse incision was made across the abdomen in a Pfannenstiel manner. If indicated the old scar was systematically removed with sharp dissection.  We carried the dissection down to the level of the fascia.  The fascia was incised in a curvilinear manner.  The fascia was then elevated from the rectus muscles with blunt and sharp dissection.  The rectus muscles were separated laterally exposing the peritoneum.  The peritoneum was carefully entered with care being taken to avoid bowel and bladder.  A self-retaining retractor was placed.  The visceral peritoneum was incised in a curvilinear fashion across the lower uterine segment creating a bladder flap. A transverse incision was made across the lower uterine segment and extended laterally and superiorly using the bandage scissors.  Artificial rupture membranes was performed and Meconium fluid was noted.  The infant was delivered from the cephalic position.  A nuchal cord was present. The cord was doubly clamped and cut. Cord blood was obtained if appropriate.  The infant was handed to the pediatric personnel  who then placed the  infant under heat lamps where it was cleaned dried and re-suctioned. The placenta was delivered. The hysterotomy incision was then identified on ring forceps.  The uterine cavity was cleaned with a moist lap sponge.  The hysterotomy incision was closed with a running interlocking suture of Vicryl.  Hemostasis was excellent.  Pitocin was run in the IV and the uterus was found to be firm. The posterior cul-de-sac and gutters were cleaned and inspected.  Hemostasis was noted.  The fascia was then closed with a running suture of #1 Vicryl.  Hemostasis of the subcutaneous tissues was obtained using the Bovie.  The subcutaneous tissues were closed with a running suture of 000 Vicryl.  A subcuticular suture was placed.  Steri-Strips were applied in the usual manner.  A pressure dressing was placed.  The patient went to the recovery room in stable condition.   [name] J. Sheretha Shadd, M.D. 08/16/2017 1:50 AM

## 2017-08-16 NOTE — Transfer of Care (Signed)
Immediate Anesthesia Transfer of Care Note  Patient: Barbara GipMaria Wollin  Procedure(s) Performed: CESAREAN SECTION (N/A Abdomen)  Patient Location: PACU  Anesthesia Type:Epidural  Level of Consciousness: awake, alert , oriented and patient cooperative  Airway & Oxygen Therapy: Patient Spontanous Breathing  Post-op Assessment: Report given to RN and Post -op Vital signs reviewed and stable  Post vital signs: Reviewed and stable  Last Vitals:  Vitals Value Taken Time  BP 107/77 08/16/2017  2:10 AM  Temp    Pulse 80 08/16/2017  2:10 AM  Resp 20 08/16/2017  2:10 AM  SpO2 100 % 08/16/2017  2:10 AM  Vitals shown include unvalidated device data.  Last Pain:  Vitals:   08/15/17 2131  TempSrc:   PainSc: Asleep         Complications: No apparent anesthesia complications

## 2017-08-16 NOTE — Anesthesia Post-op Follow-up Note (Signed)
Anesthesia QCDR form completed.        

## 2017-08-16 NOTE — Progress Notes (Signed)
Patient ID: Barbara GipMaria Huff, female   DOB: [birth year], 30 y.o.   MRN: [id number]   SUBJECTIVE:  Pt a little more uncomfortable with contractions. OBJECTIVE:  BP (!) 147/86   Pulse (!) 101   Temp 98.1 F (36.7 C) (Oral)   Resp 16   Ht 5\' 1"  (1.549 m)   Wt 166 lb (75.3 kg)   BMI 31.37 kg/m  No intake/output data recorded.  She has not shown cervical change. CERVIX: <0.5 cm:  25%:   -3:   posterior:   firm SVE:     CONTRACTIONS: irregular, every 5 minutes FHR: Fetal heart tracing reviewed. Variability: Good {> 6 bpm) Category I             50 cmg Misoprostol placed     Labs: RecentLabs       Lab Results  Component Value Date   WBC 10.5 08/15/2017   HGB 13.6 08/15/2017   HCT 40.6 08/15/2017   MCV 78.7 (L) 08/15/2017   PLT 439 08/15/2017      ASSESSMENT: 1) Labor curve reviewed.       Progress: Not in labor.     Membranes: intact     @ 45min after Misoprostol placed - pt underwent SROM - thin meconium.  Went to bathroom.  Possible loss of Misoprostol?        Active Problems:   * No active hospital problems. *   PLAN: continue present management Re-evaluate at @2300  - consider misoprostol vs pitocin etc.  [name] J. Millette Halberstam, M.D. 08/15/2017 7:44 PM

## 2017-08-17 NOTE — Anesthesia Postprocedure Evaluation (Signed)
Anesthesia Post Note  Patient: Barbara GipMaria Huff  Procedure(s) Performed: CESAREAN SECTION (N/A Abdomen)  Patient location during evaluation: Mother Baby Anesthesia Type: Epidural Level of consciousness: awake and alert and oriented Pain management: pain level controlled Vital Signs Assessment: post-procedure vital signs reviewed and stable Respiratory status: spontaneous breathing Cardiovascular status: stable Postop Assessment: no headache, no backache, patient able to bend at knees, able to ambulate, adequate PO intake and no apparent nausea or vomiting Anesthetic complications: no     Last Vitals:  Vitals:   08/16/17 1936 08/16/17 2325  BP: 107/65 115/76  Pulse: 75 96  Resp: 20 20  Temp: 36.7 C   SpO2: 99% 98%    Last Pain:  Vitals:   08/17/17 0400  TempSrc:   PainSc: 0-No pain                 Zachary GeorgeWeatherly,  Adreanna Fickel F

## 2017-08-17 NOTE — Progress Notes (Signed)
Provided and reviewed discharge paperwork. Patient verbalized understanding and teach back method was used. Follow up in 1 week to be made by the patient per her request. Discharged home via wheelchair with infant in car seat. Taken to visitor entrance by hospital volunteer, to be transported by husband.

## 2017-08-17 NOTE — Discharge Summary (Signed)
    Physician Obstetric Discharge Summary  Patient ID: Barbara Huff MRN: [id number] DOB/AGE: 08-Sep-1985 32 y.o.   Date of Admission: 08/15/2017  Date of Discharge: 08/17/2017  Admitting Diagnosis: Induction of labor at 4052w2d for gestational DM   Mode of Delivery: primary cesarean section       low uterine, transverse     Discharge Diagnosis: Reasons for cesarean section  Arrest of Dilation and Non-reassuring FHR   Intrapartum Procedures: epidural   Post partum procedures:   Complications: none                        Discharge Day SOAP Note:  Subjective:  The patient has no complaints.  She is ambulating well. She is taking PO well. Pain is well controlled with current medications. Patient is urinating without difficulty.   She is passing flatus.  She desires d/c  Objective  Vital signs in last 24 hours: BP 114/72 (BP Location: Left Arm)   Pulse 78   Temp 98.5 F (36.9 C) (Oral)   Resp 20   Ht 4' 11.5" (1.511 m)   Wt 184 lb (83.5 kg)   LMP 11/07/2016 (Exact Date)   SpO2 96%   Breastfeeding? Unknown   BMI 36.54 kg/m   Physical Exam: Gen: NAD Abdomen:  Dressing intact - dry Fundus Fundal Tone: Firm  Lochia Amount: Small     Data Review Labs: CBC Latest Ref Rng & Units 08/15/2017 06/14/2017 06/05/2017  WBC 3.6 - 11.0 K/uL 10.3 11.0(H) 11.0(H)  Hemoglobin 12.0 - 16.0 g/dL 11.1(L) 11.1 10.8(L)  Hematocrit 35.0 - 47.0 % 32.3(L) 33.3(L) 32.1(L)  Platelets 150 - 440 K/uL 257 240 233   AB POS  Assessment:  Active Problems:   Encounter for induction of labor   Doing well.  Normal progress as expected.     Plan:  Discharge to home  Modified rest as directed - may slowly resume normal activities with restrictions  as discussed.  Medications as written.  See below for additional.       Discharge Instructions: Per After Visit Summary. Activity: Advance as tolerated. Pelvic rest for 6 weeks.  Also refer to After Visit Summary.  Wound care discussed. Diet:  Regular Medications: Allergies as of 08/17/2017      Reactions   Codeine Nausea And Vomiting      Medication List    TAKE these medications   ferrous sulfate 325 (65 FE) MG tablet Take 325 mg by mouth daily with breakfast.   prenatal multivitamin Tabs tablet Take 1 tablet by mouth daily at 12 noon.      Outpatient follow up:  Follow-up Information    Linzie CollinEvans, Brette Cast James, MD Follow up in 1 week(s).   Specialty:  Obstetrics and Gynecology Contact information: [address] [place] [address] [phone]          Postpartum contraception: Will discuss at first post-partum visit.  Discharged Condition: good  Discharged to: home  Newborn Data: Disposition:home with mother  Apgars: APGAR (1 MIN): 8   APGAR (5 MINS): 8   APGAR (10 MINS):    Baby Feeding: Breast  Barbara Huff, M.D. 08/17/2017 8:47 AM

## 2017-08-23 ENCOUNTER — Encounter: Payer: Self-pay | Admitting: Obstetrics and Gynecology

## 2017-08-23 ENCOUNTER — Ambulatory Visit (INDEPENDENT_AMBULATORY_CARE_PROVIDER_SITE_OTHER): Payer: Self-pay | Admitting: Obstetrics and Gynecology

## 2017-08-23 VITALS — BP 132/88 | HR 80 | Wt 168.0 lb

## 2017-08-23 DIAGNOSIS — Z9889 Other specified postprocedural states: Secondary | ICD-10-CM

## 2017-08-23 DIAGNOSIS — L235 Allergic contact dermatitis due to other chemical products: Secondary | ICD-10-CM

## 2017-08-23 MED ORDER — PREDNISONE 10 MG (21) PO TBPK: ORAL_TABLET | ORAL | 0 refills | Status: DC

## 2017-08-23 NOTE — Progress Notes (Signed)
Pt states possible allergic reaction to hospital underwear or iodine.

## 2017-08-23 NOTE — Progress Notes (Signed)
HPI:      [name] is a 32 y.o. [phone] who LMP was No LMP recorded.  Subjective:   She presents today 1 week from cesarean delivery.  She has developed a rash on her abdomen which is causing her a significant amount of itching.  She reports that she is otherwise well.  Minimal pain.  Breast-feeding full-time.    Hx: The following portions of the patient's history were reviewed and updated as appropriate:             She  has a past medical history of SAB (spontaneous abortion). She does not have any pertinent problems on file. She  has a past surgical history that includes Diagnostic laparoscopy with removal of ectopic pregnancy (Left, 06/10/2015); Laparoscopic lysis of adhesions (N/A, 06/10/2015); and Cesarean section (N/A, 08/16/2017). Her family history includes Asthma in her maternal grandmother; Colon cancer in her maternal grandmother; Diabetes in her maternal grandmother. She  reports that she has never smoked. She has never used smokeless tobacco. She reports that she does not drink alcohol or use drugs. She has a current medication list which includes the following prescription(s): ferrous sulfate and prenatal multivitamin. She is allergic to codeine.       Review of Systems:  Review of Systems  Constitutional: Denied constitutional symptoms, night sweats, recent illness, fatigue, fever, insomnia and weight loss.  Eyes: Denied eye symptoms, eye pain, photophobia, vision change and visual disturbance.  Ears/Nose/Throat/Neck: Denied ear, nose, throat or neck symptoms, hearing loss, nasal discharge, sinus congestion and sore throat.  Cardiovascular: Denied cardiovascular symptoms, arrhythmia, chest pain/pressure, edema, exercise intolerance, orthopnea and palpitations.  Respiratory: Denied pulmonary symptoms, asthma, pleuritic pain, productive sputum, cough, dyspnea and wheezing.  Gastrointestinal: Denied, gastro-esophageal reflux, melena, nausea and vomiting.  Genitourinary:  Denied genitourinary symptoms including symptomatic vaginal discharge, pelvic relaxation issues, and urinary complaints.  Musculoskeletal: Denied musculoskeletal symptoms, stiffness, swelling, muscle weakness and myalgia.  Dermatologic: See HPI for additional information.  Neurologic: Denied neurology symptoms, dizziness, headache, neck pain and syncope.  Psychiatric: Denied psychiatric symptoms, anxiety and depression.  Endocrine: Denied endocrine symptoms including hot flashes and night sweats.   Meds:   Current Outpatient Medications on File Prior to Visit  Medication Sig Dispense Refill  . ferrous sulfate 325 (65 FE) MG tablet Take 325 mg by mouth daily with breakfast.    . Prenatal Vit-Fe Fumarate-FA (PRENATAL MULTIVITAMIN) TABS tablet Take 1 tablet by mouth daily at 12 noon.     No current facility-administered medications on file prior to visit.     Objective:     Vitals:   08/23/17 1121  BP: 132/88  Pulse: 80               Abdomen: Soft.  Non-tender.  No masses.  No HSM.  Incision/s: Intact.  Healing well.  No erythema.  No drainage.   Erythematous raised rash over abdomen-appears to be area of cleansing solution from cesarean delivery.  Assessment:    [id number] Patient Active Problem List   Diagnosis Date Noted  . Encounter for induction of labor 08/15/2017  . Diet controlled gestational diabetes mellitus (GDM) in third trimester 08/01/2017  . 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 Obesity (BMI 30.0-34.9) 12/27/2016  . Acute pyelonephritis 06/24/2015  . Status post laparoscopy 06/17/2015  . Unruptured tubal pregnancy 06/10/2015     1. Post-operative state   2. Allergic dermatitis due to other chemical product     Patient doing well postop other than the  contact dermatitis   Plan:            1.  Steroid Dosepak for dermatitis  2.  Wound care discussed.  Orders No orders of the defined types were placed in this encounter.   No orders of the defined  types were placed in this encounter.     F/U  Return in about 5 weeks (around 09/27/2017).  Elonda Husky, M.D. 08/23/2017 11:51 AM

## 2017-08-24 ENCOUNTER — Encounter: Payer: Self-pay | Admitting: Dietician

## 2017-08-24 NOTE — Progress Notes (Signed)
Patient has not rescheduled her missed appointment from 07/05/17; delivered baby on 08/16/17. Sent letter to referring provider.

## 2017-09-03 ENCOUNTER — Emergency Department
Admission: EM | Admit: 2017-09-03 | Discharge: 2017-09-03 | Disposition: A | Discharge status: Home / Self Care | Payer: Medicaid Other | Attending: Emergency Medicine | Admitting: Emergency Medicine

## 2017-09-03 ENCOUNTER — Encounter: Payer: Self-pay | Admitting: Emergency Medicine

## 2017-09-03 ENCOUNTER — Emergency Department: Payer: Medicaid Other

## 2017-09-03 DIAGNOSIS — R0602 Shortness of breath: Secondary | ICD-10-CM | POA: Insufficient documentation

## 2017-09-03 DIAGNOSIS — R079 Chest pain, unspecified: Secondary | ICD-10-CM

## 2017-09-03 DIAGNOSIS — R0789 Other chest pain: Secondary | ICD-10-CM | POA: Diagnosis present

## 2017-09-03 DIAGNOSIS — Z79899 Other long term (current) drug therapy: Secondary | ICD-10-CM | POA: Diagnosis not present

## 2017-09-03 DIAGNOSIS — M546 Pain in thoracic spine: Secondary | ICD-10-CM

## 2017-09-03 LAB — COMPREHENSIVE METABOLIC PANEL
ALBUMIN: 3.2 g/dL — AB (ref 3.5–5.0)
ALT: 36 U/L (ref 0–44)
ANION GAP: 8 (ref 5–15)
AST: 78 U/L — AB (ref 15–41)
Alkaline Phosphatase: 130 U/L — ABNORMAL HIGH (ref 38–126)
BUN: 13 mg/dL (ref 6–20)
CO2: 26 mmol/L (ref 22–32)
CREATININE: 0.57 mg/dL (ref 0.44–1.00)
Calcium: 8.6 mg/dL — ABNORMAL LOW (ref 8.9–10.3)
Chloride: 105 mmol/L (ref 98–111)
GFR calc Af Amer: >60 mL/min (ref >60)
GFR calc non Af Amer: >60 mL/min (ref >60)
GLUCOSE: 96 mg/dL (ref 70–99)
POTASSIUM: 4.1 mmol/L (ref 3.5–5.1)
SODIUM: 139 mmol/L (ref 135–145)
Total Bilirubin: 1.1 mg/dL (ref 0.3–1.2)
Total Protein: 6.7 g/dL (ref 6.5–8.1)

## 2017-09-03 LAB — CBC WITH DIFFERENTIAL/PLATELET
BASOS ABS: 0.1 10*3/uL (ref 0–0.1)
BASOS PCT: 1 %
EOS ABS: 0.2 10*3/uL (ref 0–0.7)
Eosinophils Relative: 2 %
HCT: 32.3 % — ABNORMAL LOW (ref 35.0–47.0)
Hemoglobin: 11.3 g/dL — ABNORMAL LOW (ref 12.0–16.0)
Lymphocytes Relative: 23 %
Lymphs Abs: 2.3 10*3/uL (ref 1.0–3.6)
MCH: 29.1 pg (ref 26.0–34.0)
MCHC: 34.8 g/dL (ref 32.0–36.0)
MCV: 83.5 fL (ref 80.0–100.0)
MONOS PCT: 4 %
Monocytes Absolute: 0.4 10*3/uL (ref 0.2–0.9)
NEUTROS ABS: 7.3 10*3/uL — AB (ref 1.4–6.5)
Neutrophils Relative %: 70 %
PLATELETS: 328 10*3/uL (ref 150–440)
RBC: 3.87 MIL/uL (ref 3.80–5.20)
RDW: 14.9 % — AB (ref 11.5–14.5)
WBC: 10.3 10*3/uL (ref 3.6–11.0)

## 2017-09-03 LAB — TROPONIN I
Troponin I: <0.03 ng/mL (ref <0.03)
Troponin I: <0.03 ng/mL (ref <0.03)

## 2017-09-03 LAB — BRAIN NATRIURETIC PEPTIDE: B NATRIURETIC PEPTIDE 5: 22 pg/mL (ref 0.0–100.0)

## 2017-09-03 MED ORDER — IOPAMIDOL (ISOVUE-370) INJECTION 76%
75.0000 mL | Freq: Once | INTRAVENOUS | Status: AC | PRN
  Administered 2017-09-03: 75 mL via INTRAVENOUS

## 2017-09-03 MED ORDER — OXYCODONE-ACETAMINOPHEN 5-325 MG PO TABS: 1.0000 | ORAL_TABLET | ORAL | 0 refills | Status: DC | PRN

## 2017-09-03 MED ORDER — CYCLOBENZAPRINE HCL 10 MG PO TABS
5.0000 mg | ORAL_TABLET | Freq: Once | ORAL | Status: AC
  Administered 2017-09-03: 5 mg via ORAL
  Filled 2017-09-03: qty 1

## 2017-09-03 MED ORDER — OXYCODONE-ACETAMINOPHEN 5-325 MG PO TABS
ORAL_TABLET | ORAL | Status: AC
  Administered 2017-09-03: 1 via ORAL
  Filled 2017-09-03: qty 1

## 2017-09-03 MED ORDER — IBUPROFEN 800 MG PO TABS: 800.0000 mg | ORAL_TABLET | Freq: Three times a day (TID) | ORAL | 0 refills | Status: DC | PRN

## 2017-09-03 MED ORDER — KETOROLAC TROMETHAMINE 30 MG/ML IJ SOLN
10.0000 mg | Freq: Once | INTRAMUSCULAR | Status: AC
  Administered 2017-09-03: 9.9 mg via INTRAVENOUS
  Filled 2017-09-03: qty 1

## 2017-09-03 MED ORDER — HYDROCODONE-ACETAMINOPHEN 5-325 MG PO TABS
1.0000 | ORAL_TABLET | Freq: Once | ORAL | Status: DC
  Filled 2017-09-03: qty 1

## 2017-09-03 MED ORDER — SODIUM CHLORIDE 0.9 % IV BOLUS
500.0000 mL | Freq: Once | INTRAVENOUS | Status: AC
  Administered 2017-09-03: 500 mL via INTRAVENOUS

## 2017-09-03 MED ORDER — CYCLOBENZAPRINE HCL 5 MG PO TABS: ORAL_TABLET | ORAL | 0 refills | Status: DC

## 2017-09-03 MED ORDER — OXYCODONE-ACETAMINOPHEN 5-325 MG PO TABS
1.0000 | ORAL_TABLET | Freq: Once | ORAL | Status: AC
  Administered 2017-09-03: 1 via ORAL

## 2017-09-03 NOTE — Discharge Instructions (Addendum)
1.  You may take medicines as needed for pain and muscle spasms (Motrin/Percocet/Flexeril #15). 2.  Pump and dump the first ounce of breastmilk after you take Percocet and Flexeril. 3.  Apply moist heat to affected area several times daily. 4.  Return to the ER for worsening symptoms, persistent vomiting, difficulty breathing or other concerns.

## 2017-09-03 NOTE — ED Notes (Signed)

## 2017-09-03 NOTE — ED Provider Notes (Signed)
[facility] Emergency Department Provider Note   ____________________________________________   First MD Initiated Contact with Patient 09/03/17 [phone]     (approximate)  I have reviewed the triage vital signs and the nursing notes.   HISTORY  Chief Complaint Chest Pain and Shortness of Breath    HPI Barbara Huff is a 32 y.o. female presents to the ED from home with a chief complaint of chest and back pain.  Patient had a C-section 2 weeks ago; currently breast-feeding.  She had just finished feeding her baby and was laying on her right side.  She turned over to her left side and had a sudden onset of sharp pain in her left upper back radiating to her chest.  Feels like she cannot take a deep breath.  Denies pregnancy complications or preeclampsia.  Denies recent fever, chills, cough, abdominal pain, nausea or vomiting.  Recently finished a Medrol Dosepak for abdominal contact dermatitis from the cleaning solution at surgery.   Past Medical History:  Diagnosis Date  . SAB (spontaneous abortion)     Patient Active Problem List   Diagnosis Date Noted  . Encounter for induction of labor 08/15/2017  . Diet controlled gestational diabetes mellitus (GDM) in third trimester 08/01/2017  . 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 Obesity (BMI 30.0-34.9) 12/27/2016  . Acute pyelonephritis 06/24/2015  . Status post laparoscopy 06/17/2015  . Unruptured tubal pregnancy 06/10/2015    Past Surgical History:  Procedure Laterality Date  . CESAREAN SECTION N/A 08/16/2017   Procedure: CESAREAN SECTION;  Surgeon: Linzie CollinEvans, David James, MD;  Location: [facility] ORS;  Service: Obstetrics;  Laterality: N/A;  . DIAGNOSTIC LAPAROSCOPY WITH REMOVAL OF ECTOPIC PREGNANCY Left 06/10/2015   Procedure: DIAGNOSTIC LAPAROSCOPY WITH REMOVAL OF ECTOPIC PREGNANCY;  Surgeon: Herold HarmsMartin A Defrancesco, MD;  Location: [facility] ORS;  Service: Gynecology;  Laterality: Left;  . LAPAROSCOPIC LYSIS OF ADHESIONS N/A  06/10/2015   Procedure: LAPAROSCOPIC LYSIS OF ADHESIONS;  Surgeon: Herold HarmsMartin A Defrancesco, MD;  Location: [facility] ORS;  Service: Gynecology;  Laterality: N/A;    Prior to Admission medications   Medication Sig Start Date End Date Taking? Authorizing Provider  ferrous sulfate 325 (65 FE) MG tablet Take 325 mg by mouth daily with breakfast.   Yes [provider]  Prenatal Vit-Fe Fumarate-FA (PRENATAL MULTIVITAMIN) TABS tablet Take 1 tablet by mouth daily at 12 noon.   Yes [provider]  cyclobenzaprine (FLEXERIL) 5 MG tablet 1 tablet every 8 hours as needed for muscle spasms 09/03/17   Irean HongSung, Jade J, MD  ibuprofen (ADVIL,MOTRIN) 800 MG tablet Take 1 tablet (800 mg total) by mouth every 8 (eight) hours as needed for moderate pain. 09/03/17   Irean HongSung, Jade J, MD  oxyCODONE-acetaminophen (PERCOCET/ROXICET) 5-325 MG tablet Take 1 tablet by mouth every 4 (four) hours as needed for severe pain. 09/03/17   Irean HongSung, Jade J, MD  predniSONE (STERAPRED UNI-PAK 21 TAB) 10 MG (21) TBPK tablet Take 2 pills twice for 2 days then , 2 pills in AM and 1 in PM for 2 days then, 1 Pill in AM and 1 in PM for 2 days then, one pill daily Patient not taking: Reported on 09/03/2017 08/23/17   Linzie CollinEvans, David James, MD    Allergies Codeine  Family History  Problem Relation Age of Onset  . Asthma Maternal Grandmother   . Colon cancer Maternal Grandmother   . Diabetes Maternal Grandmother   . Ovarian cancer Neg Hx     Social History Social History  Tobacco Use  . Smoking status: Never Smoker  . Smokeless tobacco: Never Used  Substance Use Topics  . Alcohol use: No  . Drug use: No    Review of Systems  Constitutional: No fever/chills Eyes: No visual changes. ENT: No sore throat. Cardiovascular: Positive for chest pain. Respiratory: Denies shortness of breath. Gastrointestinal: No abdominal pain.  No nausea, no vomiting.  No diarrhea.  No constipation. Genitourinary: Negative for dysuria. Musculoskeletal:  Positive for back pain. Skin: Negative for rash. Neurological: Negative for headaches, focal weakness or numbness.   ____________________________________________   PHYSICAL EXAM:  VITAL SIGNS: ED Triage Vitals  Enc Vitals Group     BP 09/03/17 0049 113/80     Pulse Rate 09/03/17 0049 79     Resp 09/03/17 0049 20     Temp 09/03/17 0049 98.2 F (36.8 C)     Temp Source 09/03/17 0049 Oral     SpO2 09/03/17 0049 100 %     Weight 09/03/17 0022 165 lb (74.8 kg)     Height 09/03/17 0022 4' 11.5" (1.511 m)     Head Circumference --      Peak Flow --      Pain Score 09/03/17 0022 10     Pain Loc --      Pain Edu? --      Excl. in GC? --     Constitutional: Alert and oriented. Well appearing and in mild acute distress. Eyes: Conjunctivae are normal. PERRL. EOMI. Head: Atraumatic. Nose: No congestion/rhinnorhea. Mouth/Throat: Mucous membranes are moist.  Oropharynx non-erythematous. Neck: No stridor.   Cardiovascular: Normal rate, regular rhythm. Grossly normal heart sounds.  Good peripheral circulation. Respiratory: Normal respiratory effort.  No retractions. Lungs CTAB.  Splinting. Gastrointestinal: Soft and nontender to light or deep palpation. No distention. No abdominal bruits. No CVA tenderness.  No rash. Musculoskeletal: No lower extremity tenderness nor edema.  No joint effusions. Neurologic:  Normal speech and language. No gross focal neurologic deficits are appreciated. No gait instability. Skin:  Skin is warm, dry and intact. No rash noted. Psychiatric: Mood and affect are normal. Speech and behavior are normal.  ____________________________________________   LABS (all labs ordered are listed, but only abnormal results are displayed)  Labs Reviewed  CBC WITH DIFFERENTIAL/PLATELET - Abnormal; Notable for the following components:      Result Value   Hemoglobin 11.3 (*)    HCT 32.3 (*)    RDW 14.9 (*)    Neutro Abs 7.3 (*)    All other components within normal  limits  COMPREHENSIVE METABOLIC PANEL - Abnormal; Notable for the following components:   Calcium 8.6 (*)    Albumin 3.2 (*)    AST 78 (*)    Alkaline Phosphatase 130 (*)    All other components within normal limits  TROPONIN I  BRAIN NATRIURETIC PEPTIDE  TROPONIN I   ____________________________________________  EKG  ED ECG REPORT I, SUNG,JADE J, the attending physician, personally viewed and interpreted this ECG.   Date: 09/03/2017  EKG Time: 0030  Rate: 77  Rhythm: normal EKG, normal sinus rhythm  Axis: Normal  Intervals:none  ST&T Change: Nonspecific ____________________________________________  RADIOLOGY  ED MD interpretation: No PE  Official radiology report(s): Ct Angio Chest Pe W/cm &/or Wo Cm  Result Date: 09/03/2017 CLINICAL DATA:  32 y/o F; history of C-section 2 weeks ago. Patient presents with sudden onset of chest pain radiating to the upper back 3 hours ago. Shortness of breath. EXAM: CT ANGIOGRAPHY CHEST  WITH CONTRAST TECHNIQUE: Multidetector CT imaging of the chest was performed using the standard protocol during bolus administration of intravenous contrast. Multiplanar CT image reconstructions and MIPs were obtained to evaluate the vascular anatomy. CONTRAST:  75mL ISOVUE-370 IOPAMIDOL (ISOVUE-370) INJECTION 76% COMPARISON:  None. FINDINGS: Cardiovascular: Satisfactory opacification of the pulmonary arteries to the segmental level. No evidence of pulmonary embolism. Normal heart size. No pericardial effusion. Mediastinum/Nodes: No enlarged mediastinal, hilar, or axillary lymph nodes. Thyroid gland, trachea, and esophagus demonstrate no significant findings. Lungs/Pleura: Lungs are clear. No pleural effusion or pneumothorax. Upper Abdomen: No acute abnormality. Musculoskeletal: No chest wall abnormality. No acute or significant osseous findings. Review of the MIP images confirms the above findings. IMPRESSION: No pulmonary embolus identified.  Normal CTA of the  chest. Electronically Signed   By: Mitzi Hansen M.D.   On: 09/03/2017 02:42    ____________________________________________   PROCEDURES  Procedure(s) performed: None  Procedures  Critical Care performed: No  ____________________________________________   INITIAL IMPRESSION / ASSESSMENT AND PLAN / ED COURSE  As part of my medical decision making, I reviewed the following data within the electronic MEDICAL RECORD NUMBER History obtained from family, Nursing notes reviewed and incorporated, Labs reviewed, EKG interpreted, Old chart reviewed, Radiograph reviewed and Notes from prior ED visits   32 year old female recently postpartum who presents with sudden onset left back and chest pain. Differential diagnosis includes, but is not limited to, ACS, aortic dissection, pulmonary embolism, cardiac tamponade, pneumothorax, pneumonia, pericarditis, myocarditis, GI-related causes including esophagitis/gastritis, and musculoskeletal chest wall pain.    Will obtain screening lab work including troponin.  Urgent CTA of the chest to evaluate for pulmonary embolus.  Patient is currently breast-feeding her baby.  Clinical Course as of Sep 04 526  Mon Sep 03, 2017  0252 Updated patient and spouse of laboratory and CT results.  Will administer NSAIDs, muscle relaxer.  Repeat timed troponin.   [JS]  0525 Feeling much better after medicines.  Repeat troponin is negative.  I have asked her to pump and dump the first ounce of breastmilk after the CT scan as well as after she takes her medicines.  Will discharge home on Percocet, Motrin and Flexeril.  Strict return precautions given.  Patient and spouse verbalize understanding and agree with plan of care.   [JS]    Clinical Course User Index [JS] Irean Hong, MD     ____________________________________________   FINAL CLINICAL IMPRESSION(S) / ED DIAGNOSES  Final diagnoses:  Chest pain, unspecified type  Acute left-sided thoracic back  pain     ED Discharge Orders        Ordered    ibuprofen (ADVIL,MOTRIN) 800 MG tablet  Every 8 hours PRN     09/03/17 0527    cyclobenzaprine (FLEXERIL) 5 MG tablet     09/03/17 0527    oxyCODONE-acetaminophen (PERCOCET/ROXICET) 5-325 MG tablet  Every 4 hours PRN     09/03/17 0527       Note:  This document was prepared using Dragon voice recognition software and may include unintentional dictation errors.    Irean Hong, MD 09/03/17 [phone]

## 2017-09-03 NOTE — ED Notes (Signed)
Patient is standing in room, beside stretcher at this time with no signs of distress present.

## 2017-09-03 NOTE — ED Triage Notes (Addendum)
Patient states that she had a c section 2 weeks ago. She states that about 20 minutes ago she had sudden onset of chest pain radiating into her upper back and she states that she feels like she can not take a catch her breath because of the pain. Patient actively vomiting in triage.

## 2017-09-27 ENCOUNTER — Ambulatory Visit (INDEPENDENT_AMBULATORY_CARE_PROVIDER_SITE_OTHER): Payer: Medicaid Other | Admitting: Obstetrics and Gynecology

## 2017-09-27 ENCOUNTER — Encounter: Payer: Self-pay | Admitting: Obstetrics and Gynecology

## 2017-09-27 VITALS — BP 129/86 | HR 62 | Ht 59.5 in | Wt 167.0 lb

## 2017-09-27 DIAGNOSIS — Z9889 Other specified postprocedural states: Secondary | ICD-10-CM

## 2017-09-27 DIAGNOSIS — Z3009 Encounter for other general counseling and advice on contraception: Secondary | ICD-10-CM

## 2017-09-27 NOTE — Progress Notes (Signed)
Pt presents today for dermatitis follow up and incision check. Pt also wants to discuss Rx refills.

## 2017-09-27 NOTE — Progress Notes (Signed)
HPI:      [name] is a 32 y.o. [id number] who LMP was No LMP recorded.  Subjective:   She presents today 6 weeks postpartum and postop.  She has discontinued breast-feeding.  She is considering forms of birth control.  She reports that her rash is gone.  Reports a new rash around her incision. Patient states that she cannot go back to work unless she has absolutely no restrictions as she does heavy lifting every day. Patient has resumed intercourse without issue.  Not using birth control.    Hx: The following portions of the patient's history were reviewed and updated as appropriate:             She  has a past medical history of SAB (spontaneous abortion). She does not have any pertinent problems on file. She  has a past surgical history that includes Diagnostic laparoscopy with removal of ectopic pregnancy (Left, 06/10/2015); Laparoscopic lysis of adhesions (N/A, 06/10/2015); and Cesarean section (N/A, 08/16/2017). Her family history includes Asthma in her maternal grandmother; Colon cancer in her maternal grandmother; Diabetes in her maternal grandmother. She  reports that she has never smoked. She has never used smokeless tobacco. She reports that she does not drink alcohol or use drugs. She has a current medication list which includes the following prescription(s): cyclobenzaprine, ferrous sulfate, ibuprofen, prenatal multivitamin, oxycodone-acetaminophen, and prednisone. She is allergic to codeine.       Review of Systems:  Review of Systems  Constitutional: Denied constitutional symptoms, night sweats, recent illness, fatigue, fever, insomnia and weight loss.  Eyes: Denied eye symptoms, eye pain, photophobia, vision change and visual disturbance.  Ears/Nose/Throat/Neck: Denied ear, nose, throat or neck symptoms, hearing loss, nasal discharge, sinus congestion and sore throat.  Cardiovascular: Denied cardiovascular symptoms, arrhythmia, chest pain/pressure, edema, exercise  intolerance, orthopnea and palpitations.  Respiratory: Denied pulmonary symptoms, asthma, pleuritic pain, productive sputum, cough, dyspnea and wheezing.  Gastrointestinal: Denied, gastro-esophageal reflux, melena, nausea and vomiting.  Genitourinary: Denied genitourinary symptoms including symptomatic vaginal discharge, pelvic relaxation issues, and urinary complaints.  Musculoskeletal: Denied musculoskeletal symptoms, stiffness, swelling, muscle weakness and myalgia.  Dermatologic: Denied dermatology symptoms, rash and scar.  Neurologic: Denied neurology symptoms, dizziness, headache, neck pain and syncope.  Psychiatric: Denied psychiatric symptoms, anxiety and depression.  Endocrine: Denied endocrine symptoms including hot flashes and night sweats.   Meds:   Current Outpatient Medications on File Prior to Visit  Medication Sig Dispense Refill  . cyclobenzaprine (FLEXERIL) 5 MG tablet 1 tablet every 8 hours as needed for muscle spasms 15 tablet 0  . ferrous sulfate 325 (65 FE) MG tablet Take 325 mg by mouth daily with breakfast.    . ibuprofen (ADVIL,MOTRIN) 800 MG tablet Take 1 tablet (800 mg total) by mouth every 8 (eight) hours as needed for moderate pain. 15 tablet 0  . Prenatal Vit-Fe Fumarate-FA (PRENATAL MULTIVITAMIN) TABS tablet Take 1 tablet by mouth daily at 12 noon.    Marland Kitchen. oxyCODONE-acetaminophen (PERCOCET/ROXICET) 5-325 MG tablet Take 1 tablet by mouth every 4 (four) hours as needed for severe pain. (Patient not taking: Reported on 09/27/2017) 15 tablet 0  . predniSONE (STERAPRED UNI-PAK 21 TAB) 10 MG (21) TBPK tablet Take 2 pills twice for 2 days then , 2 pills in AM and 1 in PM for 2 days then, 1 Pill in AM and 1 in PM for 2 days then, one pill daily (Patient not taking: Reported on 09/03/2017) 21 tablet 0   No current facility-administered medications  on file prior to visit.     Objective:     Vitals:   09/27/17 1004  BP: 129/86  Pulse: 62               Abdomen: Soft.   Non-tender.  No masses.  No HSM.  Incision/s: Intact.  Healing well.  Small amount of erythema likely consistent with monilia no drainage.      Assessment:    [id number] Patient Active Problem List   Diagnosis Date Noted  . Encounter for induction of labor 08/15/2017  . Diet controlled gestational diabetes mellitus (GDM) in third trimester 08/01/2017  . 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 Obesity (BMI 30.0-34.9) 12/27/2016  . Acute pyelonephritis 06/24/2015  . Status post laparoscopy 06/17/2015  . Unruptured tubal pregnancy 06/10/2015     1. Post-operative state   2. Birth control counseling   3. Postpartum care and examination immediately after delivery     Doing well postop.  Cutaneous monilia around incision.   Plan:            1.  Patient may resume normal activities with the exception of heavy lifting.  Back to work note in 3 weeks.  2.  Advised use of antifungal cream in incision area for 1 week-expect resolution.   3.  Birth Control I discussed multiple birth control options and methods with the patient.  The risks and benefits of each were reviewed.  She is to call the office when she has reached a decision regarding birth control method.  Orders No orders of the defined types were placed in this encounter.   No orders of the defined types were placed in this encounter.     F/U  Return for She is to call at the start of next menses.  Elonda Husky, M.D. 09/27/2017 10:49 AM

## 2017-10-11 ENCOUNTER — Encounter: Payer: Self-pay | Admitting: Obstetrics and Gynecology

## 2017-10-11 ENCOUNTER — Ambulatory Visit (INDEPENDENT_AMBULATORY_CARE_PROVIDER_SITE_OTHER): Payer: Medicaid Other | Admitting: Obstetrics and Gynecology

## 2017-10-11 VITALS — BP 136/98 | HR 74 | Ht 59.5 in | Wt 171.0 lb

## 2017-10-11 DIAGNOSIS — Z9889 Other specified postprocedural states: Secondary | ICD-10-CM

## 2017-10-11 NOTE — Progress Notes (Signed)
HPI:      [name] is a 32 y.o. [phone] who LMP was No LMP recorded.  Subjective:   She presents today approximately 8 weeks postop and postpartum.  She is doing well.  She is not breast-feeding.  She is requesting a back to work note today.  She took off this long because she was required to return to work with no restrictions.  She is now ready to return.    Hx: The following portions of the patient's history were reviewed and updated as appropriate:             She  has a past medical history of SAB (spontaneous abortion). She does not have any pertinent problems on file. She  has a past surgical history that includes Diagnostic laparoscopy with removal of ectopic pregnancy (Left, 06/10/2015); Laparoscopic lysis of adhesions (N/A, 06/10/2015); and Cesarean section (N/A, 08/16/2017). Her family history includes Asthma in her maternal grandmother; Colon cancer in her maternal grandmother; Diabetes in her maternal grandmother. She  reports that she has never smoked. She has never used smokeless tobacco. She reports that she does not drink alcohol or use drugs. She has a current medication list which includes the following prescription(s): ibuprofen, cyclobenzaprine, ferrous sulfate, oxycodone-acetaminophen, prednisone, and prenatal multivitamin. She is allergic to codeine.       Review of Systems:  Review of Systems  Constitutional: Denied constitutional symptoms, night sweats, recent illness, fatigue, fever, insomnia and weight loss.  Eyes: Denied eye symptoms, eye pain, photophobia, vision change and visual disturbance.  Ears/Nose/Throat/Neck: Denied ear, nose, throat or neck symptoms, hearing loss, nasal discharge, sinus congestion and sore throat.  Cardiovascular: Denied cardiovascular symptoms, arrhythmia, chest pain/pressure, edema, exercise intolerance, orthopnea and palpitations.  Respiratory: Denied pulmonary symptoms, asthma, pleuritic pain, productive sputum, cough, dyspnea and  wheezing.  Gastrointestinal: Denied, gastro-esophageal reflux, melena, nausea and vomiting.  Genitourinary: Denied genitourinary symptoms including symptomatic vaginal discharge, pelvic relaxation issues, and urinary complaints.  Musculoskeletal: Denied musculoskeletal symptoms, stiffness, swelling, muscle weakness and myalgia.  Dermatologic: Denied dermatology symptoms, rash and scar.  Neurologic: Denied neurology symptoms, dizziness, headache, neck pain and syncope.  Psychiatric: Denied psychiatric symptoms, anxiety and depression.  Endocrine: Denied endocrine symptoms including hot flashes and night sweats.   Meds:   Current Outpatient Medications on File Prior to Visit  Medication Sig Dispense Refill  . ibuprofen (ADVIL,MOTRIN) 800 MG tablet Take 1 tablet (800 mg total) by mouth every 8 (eight) hours as needed for moderate pain. 15 tablet 0  . cyclobenzaprine (FLEXERIL) 5 MG tablet 1 tablet every 8 hours as needed for muscle spasms (Patient not taking: Reported on 10/11/2017) 15 tablet 0  . ferrous sulfate 325 (65 FE) MG tablet Take 325 mg by mouth daily with breakfast.    . oxyCODONE-acetaminophen (PERCOCET/ROXICET) 5-325 MG tablet Take 1 tablet by mouth every 4 (four) hours as needed for severe pain. (Patient not taking: Reported on 09/27/2017) 15 tablet 0  . predniSONE (STERAPRED UNI-PAK 21 TAB) 10 MG (21) TBPK tablet Take 2 pills twice for 2 days then , 2 pills in AM and 1 in PM for 2 days then, 1 Pill in AM and 1 in PM for 2 days then, one pill daily (Patient not taking: Reported on 09/03/2017) 21 tablet 0  . Prenatal Vit-Fe Fumarate-FA (PRENATAL MULTIVITAMIN) TABS tablet Take 1 tablet by mouth daily at 12 noon.     No current facility-administered medications on file prior to visit.     Objective:  Vitals:   10/11/17 0831  BP: (!) 136/98  Pulse: 74               Abdomen: Soft.  Non-tender.  No masses.  No HSM.  Complete resolution of rash.  No further evidence of monilia.   Incision/s: Intact.  Healing well.  No erythema.  No drainage.      Assessment:    [id number] Patient Active Problem List   Diagnosis Date Noted  . Encounter for induction of labor 08/15/2017  . Diet controlled gestational diabetes mellitus (GDM) in third trimester 08/01/2017  . 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 Obesity (BMI 30.0-34.9) 12/27/2016  . Acute pyelonephritis 06/24/2015  . Status post laparoscopy 06/17/2015  . Unruptured tubal pregnancy 06/10/2015     1. Post-operative state     Patient doing well postop may return to work.   Plan:            1.  Patient may resume normal activities without restriction. Orders No orders of the defined types were placed in this encounter.   No orders of the defined types were placed in this encounter.     F/U  Return in about 3 months (around 01/10/2018) for Annual Physical. I spent 12 minutes involved in the care of this patient of which greater than 50% was spent discussing surgery, rash, return to work, incision care, all questions answered.  [name] J. Zayvon Alicea, M.D. 10/11/2017 9:11 AM

## 2017-10-11 NOTE — Progress Notes (Signed)
Pt presents today for work clearance. Pt states she is feeling well and is ready to go back to work. Pt states she is sexually active and does not use birth control and would like a UPT.

## 2017-10-16 ENCOUNTER — Telehealth: Payer: Self-pay | Admitting: Obstetrics and Gynecology

## 2017-10-16 NOTE — Telephone Encounter (Signed)
Barbara Huff is having a brownish discharge but not a full period/cycle and is asking for a nurse to call her and verify if she needs to have her appointment now for her IUD insertion or should she wait until she has a more regular cycle, please advise, thanks.

## 2017-10-16 NOTE — Telephone Encounter (Signed)
Per VT pt needs Friday afternoon. Mad is off Friday. ASC Friday at 2 or earlier.   VT to make Pt aware and schedule.

## 2017-10-19 ENCOUNTER — Ambulatory Visit (INDEPENDENT_AMBULATORY_CARE_PROVIDER_SITE_OTHER): Payer: Medicaid Other | Admitting: Obstetrics and Gynecology

## 2017-10-19 ENCOUNTER — Encounter: Payer: Self-pay | Admitting: Obstetrics and Gynecology

## 2017-10-19 VITALS — BP 126/84 | HR 72 | Ht 59.5 in | Wt 173.4 lb

## 2017-10-19 DIAGNOSIS — Z30430 Encounter for insertion of intrauterine contraceptive device: Secondary | ICD-10-CM

## 2017-10-19 NOTE — Progress Notes (Signed)
Pt is present today to have Mirena insertion. Pt stated that she doing well and started her cycle on Monday, Sept 16, 2019.

## 2017-10-19 NOTE — Progress Notes (Signed)
     GYNECOLOGY OFFICE PROCEDURE NOTE  Barbara Huff is a 32 y.o. [phone] here for Mirena IUD insertion. No GYN concerns.  Last pap smear was on 02/07/2017 and was normal.  Patient's last menstrual period was 10/15/2017.   IUD Insertion Procedure Note Patient identified, informed consent performed, consent signed.   Discussed risks of irregular bleeding, cramping, infection, malpositioning or misplacement of the IUD outside the uterus which may require further procedure such as laparoscopy. Time out was performed.  Urine pregnancy test negative.  Speculum placed in the vagina.  Cervix visualized.  Cleaned with Betadine x 2.  Grasped anteriorly with a single tooth tenaculum.  Uterus sounded to 9 cm.  Mirena IUD placed per manufacturer's recommendations.  Strings trimmed to 3 cm. Tenaculum was removed, good hemostasis noted.  Patient tolerated procedure well.   Patient was given post-procedure instructions.  She was advised to have backup contraception for one week.  Patient was also asked to check IUD strings periodically and follow up in 4 weeks for IUD check.   Lot #: [id number] Exp: 01/2020   Hildred Laserherry, Livian Vanderbeck, MD [facility]

## 2017-11-15 ENCOUNTER — Encounter: Payer: Medicaid Other | Admitting: Obstetrics and Gynecology

## 2017-11-20 ENCOUNTER — Ambulatory Visit (INDEPENDENT_AMBULATORY_CARE_PROVIDER_SITE_OTHER): Payer: Medicaid Other | Admitting: Obstetrics and Gynecology

## 2017-11-20 ENCOUNTER — Encounter: Payer: Self-pay | Admitting: Obstetrics and Gynecology

## 2017-11-20 VITALS — BP 130/83 | HR 82 | Ht 59.5 in | Wt 171.0 lb

## 2017-11-20 DIAGNOSIS — Z30431 Encounter for routine checking of intrauterine contraceptive device: Secondary | ICD-10-CM

## 2017-11-20 NOTE — Progress Notes (Signed)
HPI:      [name] is a 32 y.o. [phone] who LMP was No LMP recorded.  Subjective:   She presents today for follow-up of IUD.  She reports no problems.  Reports no bleeding.  Has had intercourse without problem.    Hx: The following portions of the patient's history were reviewed and updated as appropriate:             She  has a past medical history of SAB (spontaneous abortion). She does not have any pertinent problems on file. She  has a past surgical history that includes Diagnostic laparoscopy with removal of ectopic pregnancy (Left, 06/10/2015); Laparoscopic lysis of adhesions (N/A, 06/10/2015); and Cesarean section (N/A, 08/16/2017). Her family history includes Asthma in her maternal grandmother; Colon cancer in her maternal grandmother; Diabetes in her maternal grandmother. She  reports that she has never smoked. She has never used smokeless tobacco. She reports that she does not drink alcohol or use drugs. She currently has no medications in their medication list. She is allergic to codeine.       Review of Systems:  Review of Systems  Constitutional: Denied constitutional symptoms, night sweats, recent illness, fatigue, fever, insomnia and weight loss.  Eyes: Denied eye symptoms, eye pain, photophobia, vision change and visual disturbance.  Ears/Nose/Throat/Neck: Denied ear, nose, throat or neck symptoms, hearing loss, nasal discharge, sinus congestion and sore throat.  Cardiovascular: Denied cardiovascular symptoms, arrhythmia, chest pain/pressure, edema, exercise intolerance, orthopnea and palpitations.  Respiratory: Denied pulmonary symptoms, asthma, pleuritic pain, productive sputum, cough, dyspnea and wheezing.  Gastrointestinal: Denied, gastro-esophageal reflux, melena, nausea and vomiting.  Genitourinary: Denied genitourinary symptoms including symptomatic vaginal discharge, pelvic relaxation issues, and urinary complaints.  Musculoskeletal: Denied musculoskeletal  symptoms, stiffness, swelling, muscle weakness and myalgia.  Dermatologic: Denied dermatology symptoms, rash and scar.  Neurologic: Denied neurology symptoms, dizziness, headache, neck pain and syncope.  Psychiatric: Denied psychiatric symptoms, anxiety and depression.  Endocrine: Denied endocrine symptoms including hot flashes and night sweats.   Meds:   No current outpatient medications on file prior to visit.   No current facility-administered medications on file prior to visit.     Objective:     Vitals:   11/20/17 1537  BP: 130/83  Pulse: 82              Physical examination   Pelvic:   Vulva: Normal appearance.  No lesions.  Vagina: No lesions or abnormalities noted.  Support: Normal pelvic support.  Urethra No masses tenderness or scarring.  Meatus Normal size without lesions or prolapse.  Cervix: Normal appearance.  No lesions. IUD strings noted at cervical os.  Anus: Normal exam.  No lesions.  Perineum: Normal exam.  No lesions.        Bimanual   Uterus: Normal size.  Non-tender.  Mobile.  AV.  Adnexae: No masses.  Non-tender to palpation.  Cul-de-sac: Negative for abnormality.     Assessment:    [id number] Patient Active Problem List   Diagnosis Date Noted  . Encounter for induction of labor 08/15/2017  . Diet controlled gestational diabetes mellitus (GDM) in third trimester 08/01/2017  . 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 Obesity (BMI 30.0-34.9) 12/27/2016  . Acute pyelonephritis 06/24/2015  . Status post laparoscopy 06/17/2015  . Unruptured tubal pregnancy 06/10/2015     1. Surveillance of previously prescribed intrauterine contraceptive device        Plan:            1.  Follow-up for annual examination. Orders No orders of the defined types were placed in this encounter.   No orders of the defined types were placed in this encounter.     F/U  Return for Annual Physical.  Elonda Husky, M.D. 11/20/2017 3:58 PM

## 2018-04-25 ENCOUNTER — Telehealth: Payer: Self-pay | Admitting: Obstetrics and Gynecology

## 2018-04-25 ENCOUNTER — Other Ambulatory Visit (INDEPENDENT_AMBULATORY_CARE_PROVIDER_SITE_OTHER): Payer: Self-pay

## 2018-04-25 ENCOUNTER — Other Ambulatory Visit: Payer: Self-pay

## 2018-04-25 DIAGNOSIS — R399 Unspecified symptoms and signs involving the genitourinary system: Secondary | ICD-10-CM

## 2018-04-25 LAB — POCT URINALYSIS DIPSTICK
Bilirubin, UA: NEGATIVE
Blood, UA: NEGATIVE
Glucose, UA: NEGATIVE
Ketones, UA: NEGATIVE
Leukocytes, UA: NEGATIVE
NITRITE UA: NEGATIVE
Protein, UA: POSITIVE — AB
SPEC GRAV UA: 1.015 (ref 1.010–1.025)
UROBILINOGEN UA: 0.2 U/dL
pH, UA: 7 (ref 5.0–8.0)

## 2018-04-25 NOTE — Telephone Encounter (Signed)
Patient called stating she has frequency, urgency and discomfort with urination. She denies any burning or itching. She didn't know if she needed an appointment. She would like a call back.Thanks

## 2018-04-25 NOTE — Progress Notes (Signed)
Pt called and came by to drop off her urine stating that she was having UTI symptoms or it was her IUD that was giving her issues.

## 2018-04-27 LAB — URINE CULTURE

## 2018-04-30 ENCOUNTER — Other Ambulatory Visit: Payer: Self-pay | Admitting: Obstetrics and Gynecology

## 2018-04-30 DIAGNOSIS — Z975 Presence of (intrauterine) contraceptive device: Secondary | ICD-10-CM

## 2018-05-01 ENCOUNTER — Other Ambulatory Visit: Payer: Self-pay

## 2018-05-01 ENCOUNTER — Ambulatory Visit (INDEPENDENT_AMBULATORY_CARE_PROVIDER_SITE_OTHER): Payer: Self-pay

## 2018-05-01 DIAGNOSIS — R102 Pelvic and perineal pain: Secondary | ICD-10-CM

## 2018-05-01 DIAGNOSIS — Z975 Presence of (intrauterine) contraceptive device: Secondary | ICD-10-CM

## 2018-05-09 ENCOUNTER — Telehealth: Payer: Self-pay | Admitting: Obstetrics and Gynecology

## 2018-05-09 NOTE — Telephone Encounter (Signed)
Spoke with pt's mother and asked her to please ask the pt to call the office on Monday to speak with her about her u/s results.

## 2018-05-09 NOTE — Telephone Encounter (Signed)
Attempted to contact patient regarding ultrasound results.  Phone not accepting calls. Patient does not have Mychart.  Sent regular email to patient to contact office. If no response will contact her emergency contact listed.   Dr. Valentino Saxon

## 2018-05-15 NOTE — Telephone Encounter (Signed)
Spoke with pt and informed her that her iud was not in place and she made an appointment to see [facility] on Friday, April 17th at 2pm.

## 2018-05-15 NOTE — Telephone Encounter (Signed)
Called pt's mother line was busy and was unable to connect pt.

## 2018-05-15 NOTE — Telephone Encounter (Signed)
Called pt's husband was disconnect.

## 2018-05-15 NOTE — Telephone Encounter (Signed)
Called pt number on file is disconnected.

## 2018-05-15 NOTE — Telephone Encounter (Signed)
Attempted to contact pt from this number and someone answered the phone and when I asked for Molli Hazard they stated that I had the wrong number.

## 2018-05-17 ENCOUNTER — Ambulatory Visit: Payer: Medicaid Other | Admitting: Obstetrics and Gynecology

## 2018-05-17 NOTE — Progress Notes (Deleted)
Pt present today due to after an u/s her IUD was not in place. Pt is present to have her IUD removed and new one reinserted. UPT-

## 2018-06-12 ENCOUNTER — Emergency Department: Payer: HRSA Program

## 2018-06-12 ENCOUNTER — Other Ambulatory Visit: Payer: Self-pay

## 2018-06-12 ENCOUNTER — Emergency Department
Admission: EM | Admit: 2018-06-12 | Discharge: 2018-06-12 | Disposition: A | Discharge status: Home / Self Care | Payer: HRSA Program | Attending: Emergency Medicine | Admitting: Emergency Medicine

## 2018-06-12 DIAGNOSIS — J40 Bronchitis, not specified as acute or chronic: Secondary | ICD-10-CM | POA: Diagnosis not present

## 2018-06-12 DIAGNOSIS — Z79899 Other long term (current) drug therapy: Secondary | ICD-10-CM | POA: Diagnosis not present

## 2018-06-12 DIAGNOSIS — R0602 Shortness of breath: Secondary | ICD-10-CM | POA: Diagnosis present

## 2018-06-12 DIAGNOSIS — E119 Type 2 diabetes mellitus without complications: Secondary | ICD-10-CM | POA: Diagnosis not present

## 2018-06-12 DIAGNOSIS — U071 COVID-19: Secondary | ICD-10-CM | POA: Insufficient documentation

## 2018-06-12 MED ORDER — ALBUTEROL SULFATE HFA 108 (90 BASE) MCG/ACT IN AERS: 2.0000 | INHALATION_SPRAY | RESPIRATORY_TRACT | 0 refills | Status: DC | PRN

## 2018-06-12 MED ORDER — ACETAMINOPHEN 500 MG PO TABS
1000.0000 mg | ORAL_TABLET | Freq: Once | ORAL | Status: AC
Start: 2018-06-12 — End: 2018-06-12
  Administered 2018-06-12: 17:00:00 1000 mg via ORAL
  Filled 2018-06-12: qty 2

## 2018-06-12 NOTE — Discharge Instructions (Addendum)
You were found to have the coronavirus infection by the health department.  You will need to quarantine at home for two weeks to ensure you don't pass the infection to others.  You should have any household members or other close contacts monitor their symptoms and request that they quarantine themselves and call the health department if they start to feel sick.     Person Under Monitoring Name: Barbara Huff  Location: [address] [place] [address]   Infection Prevention Recommendations for Individuals Confirmed to have, or Being Evaluated for, 2019 Novel Coronavirus (COVID-19) Infection Who Receive Care at Home  Individuals who are confirmed to have, or are being evaluated for, COVID-19 should follow the prevention steps below until a healthcare provider or local or state health department says they can return to normal activities.  Stay home except to get medical care You should restrict activities outside your home, except for getting medical care. Do not go to work, school, or public areas, and do not use public transportation or taxis.  Call ahead before visiting your doctor Before your medical appointment, call the healthcare provider and tell them that you have, or are being evaluated for, COVID-19 infection. This will help the healthcare providers office take steps to keep other people from getting infected. Ask your healthcare provider to call the local or state health department.  Monitor your symptoms Seek prompt medical attention if your illness is worsening (e.g., difficulty breathing). Before going to your medical appointment, call the healthcare provider and tell them that you have, or are being evaluated for, COVID-19 infection. Ask your healthcare provider to call the local or state health department.  Wear a facemask You should wear a facemask that covers your nose and mouth when you are in the same room with other people and when you visit a healthcare provider.  People who live with or visit you should also wear a facemask while they are in the same room with you.  Separate yourself from other people in your home As much as possible, you should stay in a different room from other people in your home. Also, you should use a separate bathroom, if available.  Avoid sharing household items You should not share dishes, drinking glasses, cups, eating utensils, towels, bedding, or other items with other people in your home. After using these items, you should wash them thoroughly with soap and water.  Cover your coughs and sneezes Cover your mouth and nose with a tissue when you cough or sneeze, or you can cough or sneeze into your sleeve. Throw used tissues in a lined trash can, and immediately wash your hands with soap and water for at least 20 seconds or use an alcohol-based hand rub.  Wash your [employer] your hands often and thoroughly with soap and water for at least 20 seconds. You can use an alcohol-based hand sanitizer if soap and water are not available and if your hands are not visibly dirty. Avoid touching your eyes, nose, and mouth with unwashed hands.   Prevention Steps for Caregivers and Household Members of Individuals Confirmed to have, or Being Evaluated for, COVID-19 Infection Being Cared for in the Home  If you live with, or provide care at home for, a person confirmed to have, or being evaluated for, COVID-19 infection please follow these guidelines to prevent infection:  Follow healthcare providers instructions Make sure that you understand and can help the patient follow any healthcare provider instructions for all care.  Provide  for the patients basic needs You should help the patient with basic needs in the home and provide support for getting groceries, prescriptions, and other personal needs.  Monitor the patients symptoms If they are getting sicker, call his or her medical provider and tell them that the patient  has, or is being evaluated for, COVID-19 infection. This will help the healthcare providers office take steps to keep other people from getting infected. Ask the healthcare provider to call the local or state health department.  Limit the number of people who have contact with the patient If possible, have only one caregiver for the patient. Other household members should stay in another home or place of residence. If this is not possible, they should stay in another room, or be separated from the patient as much as possible. Use a separate bathroom, if available. Restrict visitors who do not have an essential need to be in the home.  Keep older adults, very young children, and other sick people away from the patient Keep older adults, very young children, and those who have compromised immune systems or chronic health conditions away from the patient. This includes people with chronic heart, lung, or kidney conditions, diabetes, and cancer.  Ensure good ventilation Make sure that shared spaces in the home have good air flow, such as from an air conditioner or an opened window, weather permitting.  Wash your hands often Wash your hands often and thoroughly with soap and water for at least 20 seconds. You can use an alcohol based hand sanitizer if soap and water are not available and if your hands are not visibly dirty. Avoid touching your eyes, nose, and mouth with unwashed hands. Use disposable paper towels to dry your hands. If not available, use dedicated cloth towels and replace them when they become wet.  Wear a facemask and gloves Wear a disposable facemask at all times in the room and gloves when you touch or have contact with the patients blood, body fluids, and/or secretions or excretions, such as sweat, saliva, sputum, nasal mucus, vomit, urine, or feces.  Ensure the mask fits over your nose and mouth tightly, and do not touch it during use. Throw out disposable facemasks and  gloves after using them. Do not reuse. Wash your hands immediately after removing your facemask and gloves. If your personal clothing becomes contaminated, carefully remove clothing and launder. Wash your hands after handling contaminated clothing. Place all used disposable facemasks, gloves, and other waste in a lined container before disposing them with other household waste. Remove gloves and wash your hands immediately after handling these items.  Do not share dishes, glasses, or other household items with the patient Avoid sharing household items. You should not share dishes, drinking glasses, cups, eating utensils, towels, bedding, or other items with a patient who is confirmed to have, or being evaluated for, COVID-19 infection. After the person uses these items, you should wash them thoroughly with soap and water.  Wash laundry thoroughly Immediately remove and wash clothes or bedding that have blood, body fluids, and/or secretions or excretions, such as sweat, saliva, sputum, nasal mucus, vomit, urine, or feces, on them. Wear gloves when handling laundry from the patient. Read and follow directions on labels of laundry or clothing items and detergent. In general, wash and dry with the warmest temperatures recommended on the label.  Clean all areas the individual has used often Clean all touchable surfaces, such as counters, tabletops, doorknobs, bathroom fixtures, toilets, phones, keyboards, tablets, and  bedside tables, every day. Also, clean any surfaces that may have blood, body fluids, and/or secretions or excretions on them. Wear gloves when cleaning surfaces the patient has come in contact with. Use a diluted bleach solution (e.g., dilute bleach with 1 part bleach and 10 parts water) or a household disinfectant with a label that says EPA-registered for coronaviruses. To make a bleach solution at home, add 1 tablespoon of bleach to 1 quart (4 cups) of water. For a larger supply, add   cup of bleach to 1 gallon (16 cups) of water. Read labels of cleaning products and follow recommendations provided on product labels. Labels contain instructions for safe and effective use of the cleaning product including precautions you should take when applying the product, such as wearing gloves or eye protection and making sure you have good ventilation during use of the product. Remove gloves and wash hands immediately after cleaning.  Monitor yourself for signs and symptoms of illness Caregivers and household members are considered close contacts, should monitor their health, and will be asked to limit movement outside of the home to the extent possible. Follow the monitoring steps for close contacts listed on the symptom monitoring form.   ? If you have additional questions, contact your local health department or call the epidemiologist on call at [phone] (available 24/7). ? This guidance is subject to change. For the most up-to-date guidance from [facility], please refer to their website: TripMetro.huhttps://www.cdc.gov/coronavirus/2019-ncov/hcp/guidance-prevent-spread.html

## 2018-06-12 NOTE — ED Triage Notes (Signed)
Pt states she works at [employer] and has had a cough with congestion, fever 101, sob since Saturday, has not received results of her covid test from the health department yet.

## 2018-06-12 NOTE — ED Provider Notes (Signed)
[facility] Emergency Department Provider Note  ____________________________________________  Time seen: Approximately 4:33 PM  I have reviewed the triage vital signs and the nursing notes.   HISTORY  Chief Complaint URI    HPI Barbara Huff is a 33 y.o. female with no significant past medical history who comes to the ED complaining of shortness of breath, congestion, nonproductive cough, fatigue for the past 5 days.  Gradually worsening, waxing and waning.  Worse with walking, better with lying down and resting.  She has been taking ibuprofen at home with intermittent relief of her symptoms and fever control.  Here in the ED she was notified by the health department that a coronavirus test from 2 days ago has just resulted as positive.  She works at [employer], known to currently have a coronavirus outbreak.  While I am in the room with her she informed her manager that she is positive.      Past Medical History:  Diagnosis Date  . SAB (spontaneous abortion)      Patient Active Problem List   Diagnosis Date Noted  . Encounter for induction of labor 08/15/2017  . Diet controlled gestational diabetes mellitus (GDM) in third trimester 08/01/2017  . 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 Obesity (BMI 30.0-34.9) 12/27/2016  . Acute pyelonephritis 06/24/2015  . Status post laparoscopy 06/17/2015  . Unruptured tubal pregnancy 06/10/2015     Past Surgical History:  Procedure Laterality Date  . CESAREAN SECTION N/A 08/16/2017   Procedure: CESAREAN SECTION;  Surgeon: Linzie Collin, MD;  Location: [facility] ORS;  Service: Obstetrics;  Laterality: N/A;  . DIAGNOSTIC LAPAROSCOPY WITH REMOVAL OF ECTOPIC PREGNANCY Left 06/10/2015   Procedure: DIAGNOSTIC LAPAROSCOPY WITH REMOVAL OF ECTOPIC PREGNANCY;  Surgeon: Herold Harms, MD;  Location: [facility] ORS;  Service: Gynecology;  Laterality: Left;  . LAPAROSCOPIC LYSIS OF ADHESIONS N/A 06/10/2015   Procedure:  LAPAROSCOPIC LYSIS OF ADHESIONS;  Surgeon: Herold Harms, MD;  Location: [facility] ORS;  Service: Gynecology;  Laterality: N/A;     Prior to Admission medications   Medication Sig Start Date End Date Taking? Authorizing Provider  albuterol (PROVENTIL HFA) 108 (90 Base) MCG/ACT inhaler Inhale 2 puffs into the lungs every 4 (four) hours as needed for wheezing or shortness of breath. 06/12/18   Sharman Cheek, MD     Allergies Codeine   Family History  Problem Relation Age of Onset  . Asthma Maternal Grandmother   . Colon cancer Maternal Grandmother   . Diabetes Maternal Grandmother   . Ovarian cancer Neg Hx     Social History Social History   Tobacco Use  . Smoking status: Never Smoker  . Smokeless tobacco: Never Used  Substance Use Topics  . Alcohol use: No  . Drug use: No    Review of Systems  Constitutional: Positive fever and chills.  ENT:   No sore throat. No rhinorrhea. Cardiovascular:   No chest pain or syncope. Respiratory:   Positive shortness of breath and nonproductive cough. Gastrointestinal:   Negative for abdominal pain, vomiting and diarrhea.  Musculoskeletal:   Negative for focal pain or swelling All other systems reviewed and are negative except as documented above in ROS and HPI.  ____________________________________________   PHYSICAL EXAM:  VITAL SIGNS: ED Triage Vitals  Enc Vitals Group     BP 06/12/18 1422 108/65     Pulse Rate 06/12/18 1422 (!) 107     Resp 06/12/18 1422 20     Temp 06/12/18 1422 (!)  101.2 F (38.4 C)     Temp Source 06/12/18 1422 Oral     SpO2 06/12/18 1422 97 %     Weight 06/12/18 1355 186 lb (84.4 kg)     Height 06/12/18 1355 4\' 11"  (1.499 m)     Head Circumference --      Peak Flow --      Pain Score 06/12/18 1355 0     Pain Loc --      Pain Edu? --      Excl. in GC? --     Vital signs reviewed, nursing assessments reviewed.   Constitutional:   Alert and oriented. Non-toxic appearance. Eyes:    Conjunctivae are normal. EOMI. PERRL. ENT      Head:   Normocephalic and atraumatic.      Nose:   No congestion/rhinnorhea.       Mouth/Throat:   MMM, no pharyngeal erythema. No peritonsillar mass.       Neck:   No meningismus. Full ROM. Hematological/Lymphatic/Immunilogical:   No cervical lymphadenopathy. Cardiovascular:   Tachycardia heart rate 100. Symmetric bilateral radial and DP pulses.  No murmurs. Cap refill less than 2 seconds. Respiratory:   Normal respiratory effort without tachypnea/retractions.  Good air entry in all lung fields, symmetric breath sounds.  There is expiratory wheezing with cough.  Deep breathing provokes coughing. Gastrointestinal:   Soft and nontender. Non distended. There is no CVA tenderness.  No rebound, rigidity, or guarding. Musculoskeletal:   Normal range of motion in all extremities. No joint effusions.  No lower extremity tenderness.  No edema. Neurologic:   Normal speech and language.  Motor grossly intact. No acute focal neurologic deficits are appreciated.  Skin:    Skin is warm, dry and intact. No rash noted.  No petechiae, purpura, or bullae.  ____________________________________________    LABS (pertinent positives/negatives) (all labs ordered are listed, but only abnormal results are displayed) Labs Reviewed - No data to display ____________________________________________   EKG    ____________________________________________    RADIOLOGY  Dg Chest Portable 1 View  Result Date: 06/12/2018 CLINICAL DATA:  Cough and congestion.  Fever. EXAM: PORTABLE CHEST 1 VIEW COMPARISON:  Chest radiograph December 30, 2007 and chest CT September 03, 2017 FINDINGS: There is no edema or consolidation. The heart size and pulmonary vascularity are normal. No adenopathy. No bone lesions. IMPRESSION: No edema or consolidation. Electronically Signed   By: Bretta Bang III M.D.   On: 06/12/2018 14:57     ____________________________________________   PROCEDURES Procedures  ____________________________________________    CLINICAL IMPRESSION / ASSESSMENT AND PLAN / ED COURSE  Medications ordered in the ED: Medications  acetaminophen (TYLENOL) tablet 1,000 mg (has no administration in time range)    Pertinent labs & imaging results that were available during my care of the patient were reviewed by me and considered in my medical decision making (see chart for details).  Barbara Huff was evaluated in Emergency Department on 06/12/2018 for the symptoms described in the history of present illness. She was evaluated in the context of the global COVID-19 pandemic, which necessitated consideration that the patient might be at risk for infection with the SARS-CoV-2 virus that causes COVID-19. Institutional protocols and algorithms that pertain to the evaluation of patients at risk for COVID-19 are in a state of rapid change based on information released by regulatory bodies including the CDC and federal and state organizations. These policies and algorithms were followed during the patient's care in the ED.  Patient presents with viral respiratory syndrome, reported by health department to have positive COVID-19 test.  Patient is nontoxic, would benefit from continued NSAIDs at home and albuterol MDI.  I counseled her at length about home quarantine and she notes that she is already sent her 1849-month-old child to live with other family.  She will monitor her family symptoms.  She will follow-up with the health department.  We discussed at length strategies for staying hydrated and maintaining nutrition.  She has had success in the past with protein shakes and I recommended she continue to ensure that she gets adequate water and glucose sources as well.  Discussed return precautions.  Currently stable for discharge home, home quarantine and follow-up information.       ____________________________________________   FINAL CLINICAL IMPRESSION(S) / ED DIAGNOSES    Final diagnoses:  COVID-19 virus infection  Bronchitis     ED Discharge Orders         Ordered    albuterol (PROVENTIL HFA) 108 (90 Base) MCG/ACT inhaler  Every 4 hours PRN     06/12/18 1621          Portions of this note were generated with dragon dictation software. Dictation errors may occur despite best attempts at proofreading.   Sharman CheekStafford, Harlis Champoux, MD 06/12/18 [phone]

## 2018-07-29 ENCOUNTER — Telehealth: Payer: Self-pay

## 2018-07-29 NOTE — Telephone Encounter (Signed)
Pt prescreened no symptoms. 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   Coronavirus (COVID-19) Are you at risk?  Are you at risk for the Coronavirus (COVID-19)?  To be considered HIGH RISK for Coronavirus (COVID-19), you have to meet the following criteria:  . Traveled to [place], [place], [place], [place] or [place]; or in the [place] to [place], [place], [place], or [place]; and have fever, cough, and shortness of breath within the last 2 weeks of travel OR . Been in close contact with a person diagnosed with COVID-19 within the last 2 weeks and have fever, cough, and shortness of breath . IF YOU DO NOT MEET THESE CRITERIA, YOU ARE CONSIDERED LOW RISK FOR COVID-19.  What to do if you are HIGH RISK for COVID-19?  Marland Kitchen If you are having a medical emergency, call 911. . Seek medical care right away. Before you go to a doctor's office, urgent care or emergency department, call ahead and tell them about your recent travel, contact with someone diagnosed with COVID-19, and your symptoms. You should receive instructions from your physician's office regarding next steps of care.  . When you arrive at healthcare provider, tell the healthcare staff immediately you have returned from visiting [place], [place], [place], [place] or [place]; or traveled in the [place] to [place], [place], [place], or [place]; in the last two weeks or you have been in close contact with a person diagnosed with COVID-19 in the last 2 weeks.   . Tell the health care staff about your symptoms: fever, cough and shortness of breath. . After you have been seen by a medical provider, you will be either: o Tested for (COVID-19) and discharged home on quarantine except to seek medical care if symptoms worsen, and asked to  - Stay home and avoid contact with others until you get your results (4-5 days)  - Avoid travel on public transportation if possible (such as bus, train, or  airplane) or o Sent to the Emergency Department by EMS for evaluation, COVID-19 testing, and possible admission depending on your condition and test results.  What to do if you are LOW RISK for COVID-19?  Reduce your risk of any infection by using the same precautions used for avoiding the common cold or flu:  Marland Kitchen Wash your hands often with soap and warm water for at least 20 seconds.  If soap and water are not readily available, use an alcohol-based hand sanitizer with at least 60% alcohol.  . If coughing or sneezing, cover your mouth and nose by coughing or sneezing into the elbow areas of your shirt or coat, into a tissue or into your sleeve (not your hands). . Avoid shaking hands with others and consider head nods or verbal greetings only. . Avoid touching your eyes, nose, or mouth with unwashed hands.  . Avoid close contact with people who are sick. . Avoid places or events with large numbers of people in one location, like concerts or sporting events. . Carefully consider travel plans you have or are making. . If you are planning any travel outside or inside the [place], visit the CDC's Travelers' Health webpage for the latest health notices. . If you have some symptoms but not all symptoms, continue to monitor at home and seek medical attention if your symptoms worsen. . If you are having a medical emergency, call 911.   [facility]  Telehealth / e-Visit: [email or web address]         [facility]: [facility]: [phone]                   [facility]: [phone]

## 2018-07-30 ENCOUNTER — Other Ambulatory Visit: Payer: Self-pay

## 2018-07-30 ENCOUNTER — Encounter: Payer: Self-pay | Admitting: Obstetrics and Gynecology

## 2018-07-30 ENCOUNTER — Ambulatory Visit (INDEPENDENT_AMBULATORY_CARE_PROVIDER_SITE_OTHER): Payer: Medicaid Other | Admitting: Obstetrics and Gynecology

## 2018-07-30 VITALS — BP 110/74 | HR 80 | Ht 59.0 in | Wt 184.0 lb

## 2018-07-30 DIAGNOSIS — T8332XD Displacement of intrauterine contraceptive device, subsequent encounter: Secondary | ICD-10-CM

## 2018-07-30 DIAGNOSIS — Z30433 Encounter for removal and reinsertion of intrauterine contraceptive device: Secondary | ICD-10-CM

## 2018-07-30 DIAGNOSIS — Z3202 Encounter for pregnancy test, result negative: Secondary | ICD-10-CM | POA: Diagnosis not present

## 2018-07-30 LAB — POCT URINE PREGNANCY: Preg Test, Ur: NEGATIVE

## 2018-07-30 NOTE — Patient Instructions (Signed)

## 2018-07-30 NOTE — Progress Notes (Signed)
Pt is present today due to IUD misplaced. Pt stated at first she was having a lot of pain with urination and other abd pain, but has noticed lately that the pain has gone.

## 2018-07-30 NOTE — Progress Notes (Signed)
GYNECOLOGY PROGRESS NOTE  Subjective:    Patient ID: Barbara Huff, female    DOB: October 03, 1985, 33 y.o.   MRN: [id number]  HPI  Patient is a 33 y.o. G87P1021 female who presents for replacement of Mirena IUD. Patient had her Mirena IUD placed in September 2019.  In April patient began noting significant pelvic pain and abnormal bleeding. Had an ultrasound which noted that IUD may be attempting expulsion (was near cervix) and was informed that she needed to have her IUD replaced.  Notes that shortly after, her symptoms stopped, and so did not come in immediately as recommended for her IUD replacement.  She notes that she has had unprotected sex in the past several weeks.    Patient notes that she has recently been considering permanent sterilization.  Has not yet discussed with her partner but believes that she is done with childbearing. Desires to discuss options.    The following portions of the patient's history were reviewed and updated as appropriate: allergies, current medications, past family history, past medical history, past social history, past surgical history and problem list.  Review of Systems Pertinent items noted in HPI and remainder of comprehensive ROS otherwise negative.   Objective:   Blood pressure 110/74, pulse 80, height 4\' 11"  (1.499 m), weight 184 lb (83.5 kg), unknown if currently breastfeeding. General appearance: alert and no distress Abdomen: soft, non-tender; bowel sounds normal; no masses,  no organomegaly Pelvic: external genitalia normal, rectovaginal septum normal.  Vagina without discharge.  Cervix normal appearing, no lesions and no motion tenderness. IUD threads appear lengthened, ~ 4 cm in length.  Uterus mobile, nontender, normal shape and size.  Adnexae non-palpable, nontender bilaterally.     Imaging:   Patient Name: Barbara Huff DOB: Dec 19, 1985 MRN: [id number] ULTRASOUND REPORT  Location: [facility] Date of Service: 05/01/2018      Indications:Pelvic Pain  IUD placement Findings:  The uterus is anteverted and measures 8.1 x 4.3 x 3.7 cm. Echo texture is heterogenous without evidence of focal masses.  The Endometrium measures 4.4 mm.   Right Ovary measures 2.3 x 2.1 x 2.7 cm. It is normal in appearance. Left Ovary measures 2.6 x 1.5 x 1.5 cm. It is normal in appearance. Survey of the adnexa demonstrates no adnexal masses. There is no free fluid in the cul de sac.  Impression: 1. Echogenic longitudinal structure seen in the cervical os. Possible IUD. 2. No IUD or echogenic areas seen with-in the endometrium.  Recommendations: 1.Clinical correlation with the patient's History and Physical Exam.  Barbara Huff, RDMS    I have reviewed this study and agree with documented findings.    Barbara Maid, MD [facility]   Assessment:   Malpositioned IUD  Plan:   -Patient with malpositioned IUD noted on ultrasound (noted in cervical's).  Discussion had with patient regarding options of contraception including replacing her IUD versus permanent sterilization with tubal ligation.  Patient notes that she does have a history of heavy menses.  Discussed that with a tubal ligation her cycles may still be heavy and may still require further hormonal management. Patient notes she will stick with replacing her IUD.  See procedure note below.  - RTC in 4 weeks for IUD check.     GYNECOLOGY OFFICE PROCEDURE NOTE  Barbara Huff is a 33 y.o. [phone] here for Mirena IUD removal and reinsertion. No GYN concerns.  Last pap smear was on 02/17/2017 and was normal.  IUD Removal and  Reinsertion  Patient identified, informed consent performed, consent signed.   Discussed risks of irregular bleeding, cramping, infection, malpositioning or misplacement of the IUD outside the uterus which may require further procedures. Also advised to use backup contraception for one week as the risk of pregnancy is higher  during the transition period of removing an IUD and replacing it with another one. Time out was performed. Speculum placed in the vagina. The strings of the IUD were grasped and pulled using ring forceps. The IUD was successfully removed in its entirety. The cervix was cleaned with Betadine x 2 and grasped anteriorly with a single tooth tenaculum.  The new Mirena IUD insertion apparatus was used to sound the uterus to 7 cm;  the IUD was then placed per manufacturer's recommendations. Strings trimmed to 3 cm. Tenaculum was removed, good hemostasis noted. Patient tolerated procedure well.   Patient was given post-procedure instructions.  She was reminded to have backup contraception for one week during this transition period between IUDs.  Patient was also asked to check IUD strings periodically and follow up in 4 weeks for IUD check.   Barbara Huff, Barbara Trovato, MD [facility]

## 2018-08-29 ENCOUNTER — Encounter: Payer: Medicaid Other | Admitting: Obstetrics and Gynecology

## 2018-09-10 ENCOUNTER — Encounter: Payer: Self-pay | Admitting: Obstetrics and Gynecology

## 2018-09-10 ENCOUNTER — Other Ambulatory Visit: Payer: Self-pay

## 2018-09-10 ENCOUNTER — Other Ambulatory Visit (LOCAL_COMMUNITY_HEALTH_CENTER): Payer: Medicaid Other

## 2018-09-10 DIAGNOSIS — Z111 Encounter for screening for respiratory tuberculosis: Secondary | ICD-10-CM

## 2018-09-10 NOTE — Progress Notes (Signed)
Client reports allergy to "some type of wash they used before my C-section that broke me out in hives." Client does not know name of wash. Encouraged client to try to obtain info from her OB. Shona Needles, RN

## 2018-09-12 ENCOUNTER — Other Ambulatory Visit: Payer: Self-pay

## 2018-09-12 ENCOUNTER — Emergency Department
Admission: EM | Admit: 2018-09-12 | Discharge: 2018-09-12 | Disposition: A | Discharge status: Home / Self Care | Payer: Medicaid Other | Attending: Emergency Medicine | Admitting: Emergency Medicine

## 2018-09-12 DIAGNOSIS — Y998 Other external cause status: Secondary | ICD-10-CM | POA: Insufficient documentation

## 2018-09-12 DIAGNOSIS — K029 Dental caries, unspecified: Secondary | ICD-10-CM | POA: Insufficient documentation

## 2018-09-12 DIAGNOSIS — S025XXA Fracture of tooth (traumatic), initial encounter for closed fracture: Secondary | ICD-10-CM | POA: Insufficient documentation

## 2018-09-12 DIAGNOSIS — Y929 Unspecified place or not applicable: Secondary | ICD-10-CM | POA: Insufficient documentation

## 2018-09-12 DIAGNOSIS — X58XXXA Exposure to other specified factors, initial encounter: Secondary | ICD-10-CM | POA: Insufficient documentation

## 2018-09-12 DIAGNOSIS — S025XXB Fracture of tooth (traumatic), initial encounter for open fracture: Secondary | ICD-10-CM

## 2018-09-12 DIAGNOSIS — Y93G1 Activity, food preparation and clean up: Secondary | ICD-10-CM | POA: Insufficient documentation

## 2018-09-12 MED ORDER — LIDOCAINE VISCOUS HCL 2 % MT SOLN
15.0000 mL | Freq: Once | OROMUCOSAL | Status: AC
  Administered 2018-09-12: 15 mL via OROMUCOSAL
  Filled 2018-09-12: qty 15

## 2018-09-12 MED ORDER — AMOXICILLIN 500 MG PO CAPS
500.0000 mg | ORAL_CAPSULE | Freq: Once | ORAL | Status: AC
  Administered 2018-09-12: 500 mg via ORAL
  Filled 2018-09-12: qty 1

## 2018-09-12 MED ORDER — TRAMADOL HCL 50 MG PO TABS: 50.0000 mg | ORAL_TABLET | Freq: Three times a day (TID) | ORAL | 0 refills | Status: AC | PRN

## 2018-09-12 MED ORDER — AMOXICILLIN 500 MG PO CAPS: 500.0000 mg | ORAL_CAPSULE | Freq: Three times a day (TID) | ORAL | 0 refills | Status: DC

## 2018-09-12 MED ORDER — LIDOCAINE-EPINEPHRINE 2 %-1:100000 IJ SOLN
1.7000 mL | Freq: Once | INTRAMUSCULAR | Status: AC
  Administered 2018-09-12: 1.7 mL
  Filled 2018-09-12: qty 1.7

## 2018-09-12 NOTE — ED Triage Notes (Signed)
Pt comes with c/o dental pain. Pt states she was eating some chips and thinks that she cracked her tooth. Pt states pain to right lower jaw and gum. Pt states she has taken pain medication with no relief.

## 2018-09-12 NOTE — Discharge Instructions (Addendum)
You should take the antibiotic as directed. Rinse with warm-salty water after every meal. You should apply a temporary filling/cement as directed. Follow-up with one of the dental clinics listed below.  OPTIONS FOR DENTAL FOLLOW UP CARE  [facility] [phone])  [facility] [phone])  [place] [phone])  [place] [phone])  [facility] [phone]) This clinic caters to the indigent population and is on a lottery system. Location: [employer] [facility], [place], [place], [place] [facility] Hours: Wednesdays from 6pm - 9pm, patients seen by a lottery system. For dates, call or go to [email or web address] Services: Cleanings, fillings and simple extractions. Payment Options: DENTAL WORK IS FREE OF CHARGE. Bring proof of income or support. Best way to get seen: Arrive at 5:15 pm - this is a lottery, NOT first come/first serve, so arriving earlier will not increase your chances of being seen.     [facility] [phone] Select option 1 for emergencies   Location: [facility], [place], [address], [place] Clinic Hours: No walk-ins accepted - call the day before to schedule an appointment. Check in times are 9:30 am and 1:30 pm. Services: Simple extractions, temporary fillings, pulpectomy/pulp debridement, uncomplicated abscess drainage. Payment Options: PAYMENT IS DUE AT THE TIME OF SERVICE.  Fee is usually $100-200, additional surgical procedures (e.g. abscess drainage) may be extra. Cash, checks, Visa/MasterCard accepted.  Can file Medicaid if patient is covered for dental - patient should call case worker to check. No discount for [facility]  patients. Best way to get seen: MUST call the day before and get onto the schedule. Can usually be seen the next 1-2 days. No walk-ins accepted.     [place] [phone]   Location: [place], [place] [facility] Hours: M, W, Th, F 8am or 1:30pm, Tues 9a or 1:30 - first come/first served. Services: Simple extractions, temporary fillings, uncomplicated abscess drainage.  You do not need to be an [facility] resident. Payment Options: PAYMENT IS DUE AT THE TIME OF SERVICE. Dental insurance, otherwise sliding scale - bring proof of income or support. Depending on income and treatment needed, cost is usually $50-200. Best way to get seen: Arrive early as it is first come/first served.     [facility] [phone]   Location: [place] [facility] Hours: Mon-Thu 8a-5p Services: Most basic dental services including extractions and fillings. Payment Options: PAYMENT IS DUE AT THE TIME OF SERVICE. Sliding scale, up to 50% off - bring proof if income or support. Medicaid with dental option accepted. Best way to get seen: Call to schedule an appointment, can usually be seen within 2 weeks OR they will try to see walk-ins - show up at [place] or 2p (you may have to wait).     [facility] RESIDENTS ONLY   Location: [facility], [place] [address] [place], [place] [address] Clinic Hours: By appointment only. Monday - Thursday 8am-5pm, Friday 8am-12pm Services: Cleanings, fillings, extractions. Payment Options: PAYMENT IS DUE AT THE TIME OF SERVICE. Cash, Visa or MasterCard. Sliding scale - $30 minimum per service. Best way to get seen: Come in to office, complete packet and make an appointment - need proof of income or support monies for each  household member and proof of [facility] [place] residence. Usually takes about a month to  get in.     [facility] [phone]   Location: [address] [facility] Hours: Walk-in Urgent Care Dental Services are offered Monday-Friday mornings only. The numbers of emergencies accepted daily is limited to the number of providers available. Maximum 15 - Mondays, Wednesdays & Thursdays Maximum 10 - Tuesdays & Fridays Services: You do not need to be a [place] resident to be seen for a dental emergency. Emergencies are defined as pain, swelling, abnormal bleeding, or dental trauma. Walkins will receive x-rays if needed. NOTE: Dental cleaning is not an emergency. Payment Options: PAYMENT IS DUE AT THE TIME OF SERVICE. Minimum co-pay is $40.00 for uninsured patients. Minimum co-pay is $3.00 for Medicaid with dental coverage. Dental Insurance is accepted and must be presented at time of visit. Medicare does not cover dental. Forms of payment: Cash, credit card, checks. Best way to get seen: If not previously registered with the clinic, walk-in dental registration begins at 7:15 am and is on a first come/first serve basis. If previously registered with the clinic, call to make an appointment.     [facility] [phone] [place] RESIDENTS ONLY   Location: [address], [place], [place] Clinic Hours: Mon-Thu 10a-2p Services: Extractions only! Payment Options: FREE (donations accepted) - bring proof of income or support Best way to get seen: Call and schedule an appointment OR come at 8am on the 1st Monday of every month (except for holidays) when it is first come/first served.     [facility] [phone]   Location: [address], [place] [facility] Hours: Friday mornings Services, Payment Options, Best way to get seen: Call for info

## 2018-09-12 NOTE — ED Provider Notes (Signed)
[facility] Emergency Department Provider Note ____________________________________________  Time seen: 2053  I have reviewed the triage vital signs and the nursing notes.  HISTORY  Chief Complaint  Dental Pain  HPI Barbara Huff is a 33 y.o. female presents herself to the ED for evaluation of dental pain.  Patient describes pain to the right lower molar, about 2 days ago.  She admits to eating some chips at the time, and believes she chipped a tooth at that time.  Since that time she has had increasing pain to the lower right jaw.  She denies any focal swelling, purulence, or difficulty controlling secretions.  She is also had no fevers, chills, sweats, or chest pain.  She is taken over-the-counter pain medicines with limited benefit.  She is presenting now for acute dental pain secondary to a tooth fracture and underlying caries.  Past Medical History:  Diagnosis Date  . SAB (spontaneous abortion)     Patient Active Problem List   Diagnosis Date Noted  . Encounter for induction of labor 08/15/2017  . Diet controlled gestational diabetes mellitus (GDM) in third trimester 08/01/2017  . 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 Obesity (BMI 30.0-34.9) 12/27/2016  . Acute pyelonephritis 06/24/2015  . Status post laparoscopy 06/17/2015  . Unruptured tubal pregnancy 06/10/2015    Past Surgical History:  Procedure Laterality Date  . CESAREAN SECTION N/A 08/16/2017   Procedure: CESAREAN SECTION;  Surgeon: Harlin Heys, MD;  Location: [facility] ORS;  Service: Obstetrics;  Laterality: N/A;  . DIAGNOSTIC LAPAROSCOPY WITH REMOVAL OF ECTOPIC PREGNANCY Left 06/10/2015   Procedure: DIAGNOSTIC LAPAROSCOPY WITH REMOVAL OF ECTOPIC PREGNANCY;  Surgeon: Brayton Mars, MD;  Location: [facility] ORS;  Service: Gynecology;  Laterality: Left;  . LAPAROSCOPIC LYSIS OF ADHESIONS N/A 06/10/2015   Procedure: LAPAROSCOPIC LYSIS OF ADHESIONS;  Surgeon: Brayton Mars, MD;  Location:  [facility] ORS;  Service: Gynecology;  Laterality: N/A;    Prior to Admission medications   Medication Sig Start Date End Date Taking? Authorizing Provider  albuterol (PROVENTIL HFA) 108 (90 Base) MCG/ACT inhaler Inhale 2 puffs into the lungs every 4 (four) hours as needed for wheezing or shortness of breath. 06/12/18   Carrie Mew, MD  amoxicillin (AMOXIL) 500 MG capsule Take 1 capsule (500 mg total) by mouth 3 (three) times daily. 09/12/18   Josiah Nieto, Dannielle Karvonen, PA-C  levonorgestrel (MIRENA) 20 MCG/24HR IUD 1 each by Intrauterine route once.    [provider]  traMADol (ULTRAM) 50 MG tablet Take 1 tablet (50 mg total) by mouth 3 (three) times daily as needed for up to 3 days. 09/12/18 09/15/18  Nakiesha Rumsey, Dannielle Karvonen, PA-C    Allergies Codeine  Family History  Problem Relation Age of Onset  . Asthma Maternal Grandmother   . Colon cancer Maternal Grandmother   . Diabetes Maternal Grandmother   . Ovarian cancer Neg Hx     Social History Social History   Tobacco Use  . Smoking status: Never Smoker  . Smokeless tobacco: Never Used  Substance Use Topics  . Alcohol use: No  . Drug use: No    Review of Systems  Constitutional: Negative for fever. Eyes: Negative for visual changes. ENT: Negative for sore throat.  Dental pain as above. Cardiovascular: Negative for chest pain. Respiratory: Negative for shortness of breath. Gastrointestinal: Negative for abdominal pain, vomiting and diarrhea. Genitourinary: Negative for dysuria. Musculoskeletal: Negative for back pain. Skin: Negative for rash. Neurological: Negative for headaches, focal weakness or numbness. ____________________________________________  PHYSICAL EXAM:  VITAL SIGNS: ED Triage Vitals  Enc Vitals Group     BP 09/12/18 2027 (!) 144/95     Pulse Rate 09/12/18 2027 95     Resp 09/12/18 2027 18     Temp 09/12/18 2027 99.3 F (37.4 C)     Temp src --      SpO2 09/12/18 2027 100 %     Weight  09/12/18 2029 186 lb (84.4 kg)     Height 09/12/18 2029 4' 11.5" (1.511 m)     Head Circumference --      Peak Flow --      Pain Score 09/12/18 2028 10     Pain Loc --      Pain Edu? --      Excl. in GC? --     Constitutional: Alert and oriented. Well appearing and in no distress. Head: Normocephalic and atraumatic. Eyes: Conjunctivae are normal. Normal extraocular movements Mouth/Throat: Mucous membranes are moist.  Uvula is midline and tonsils are flat.  Right lower second molar noted to have a crack in defect to the buccal side with a large portion of the molar missing.  No focal gum swelling or edema is appreciated.  No brawny sublingual erythema is noted. Neck: Supple. No thyromegaly. Hematological/Lymphatic/Immunological: No cervical lymphadenopathy. Cardiovascular: Normal rate, regular rhythm. Normal distal pulses. Respiratory: Normal respiratory effort. No wheezes/rales/rhonchi. Gastrointestinal: Soft and nontender. No distention. Musculoskeletal: Nontender with normal range of motion in all extremities.  Neurologic:  Normal gait without ataxia. Normal speech and language. No gross focal neurologic deficits are appreciated. Skin:  Skin is warm, dry and intact. No rash noted. ____________________________________________  PROCEDURES  Amoxicillin 500 mg PO Lidocaine 2% viscous PO  DENTAL BLOCK  Performed by: Lissa HoardJenise V Bacon-Braniyah Besse Consent: Verbal consent obtained. Required items: devices and special equipment available Time out: Immediately prior to procedure a "time out" was called to verify the correct patient, procedure, equipment, support staff and site/side marked as required.  Indication: pain Nerve block body site: right lower 2nd molar  Preparation: Patient was prepped and draped in the usual sterile fashion. Needle gauge: 27 G Location technique: anatomical landmarks  Local anesthetic: lido w/ epi 2%1-1:00000   Anesthetic total: 1.7 ml  Outcome: pain  improved Patient tolerance: Patient tolerated the procedure well with no immediate complications. ____________________________________________  INITIAL IMPRESSION / ASSESSMENT AND PLAN / ED COURSE  [name] was evaluated in Emergency Department on 09/12/2018 for the symptoms described in the history of present illness. She was evaluated in the context of the global COVID-19 pandemic, which necessitated consideration that the patient might be at risk for infection with the SARS-CoV-2 virus that causes COVID-19. Institutional protocols and algorithms that pertain to the evaluation of patients at risk for COVID-19 are in a state of rapid change based on information released by regulatory bodies including the CDC and federal and state organizations. These policies and algorithms were followed during the patient's care in the ED.  Patient with ED evaluation of acute dental pain to the right lower jaw after a broken molar.  Patient online dental caries presents for evaluation management.  She is treated empirically with a dental block for pain relief.  She is also discharged with prescriptions for amoxicillin and #6 Ultram.  She is referred to any of the number of local dental providers for definitive management.  Return precautions have been reviewed.  I reviewed the patient's prescription history over the last 12 months in the multi-state controlled  substances database(s) that includes [place], [place], [place], [place], [place], [place], [place], [place], [place], [place], [place], [place], [place], and [place].  Results were notable for no current prescriptions.  ____________________________________________  FINAL CLINICAL IMPRESSION(S) / ED DIAGNOSES  Final diagnoses:  Open fracture of tooth, initial encounter  Pain due to dental caries      Lissa HoardMenshew, Gentry Pilson V Bacon, PA-C 09/12/18 2324    Dionne BucySiadecki, Sebastian, MD 09/12/18 2350

## 2018-09-13 ENCOUNTER — Other Ambulatory Visit: Payer: Self-pay

## 2018-09-13 ENCOUNTER — Ambulatory Visit (LOCAL_COMMUNITY_HEALTH_CENTER): Payer: Medicaid Other

## 2018-09-13 DIAGNOSIS — Z111 Encounter for screening for respiratory tuberculosis: Secondary | ICD-10-CM

## 2018-09-13 LAB — TB SKIN TEST
Induration: 0 mm
TB Skin Test: NEGATIVE

## 2018-09-17 ENCOUNTER — Encounter: Payer: Self-pay | Admitting: Obstetrics and Gynecology

## 2019-04-02 IMAGING — DX DG FOOT COMPLETE 3+V*R*
3 series · 3 of 3 positions shown · non-contrast
Comparison: None.

CLINICAL DATA: Injured foot yesterday with pain when bearing weight

EXAM:
RIGHT FOOT COMPLETE - 3+ VIEW

[foot ap]
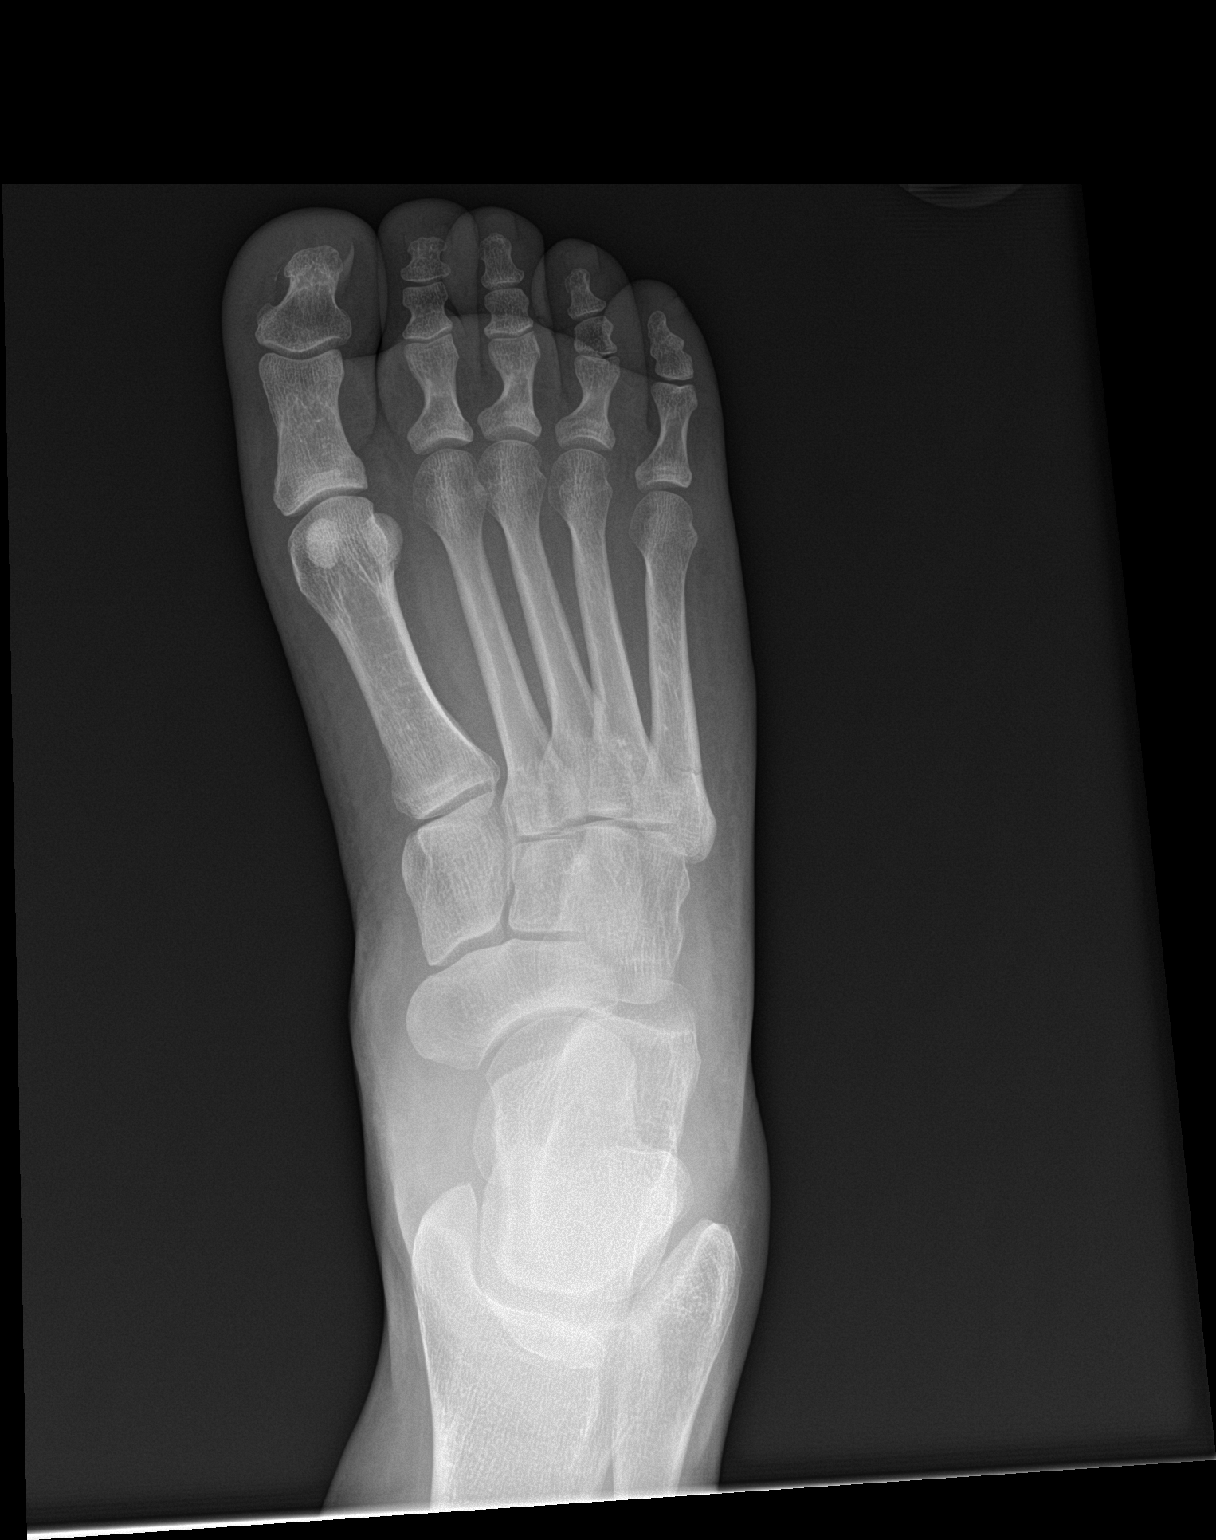

[foot obl]
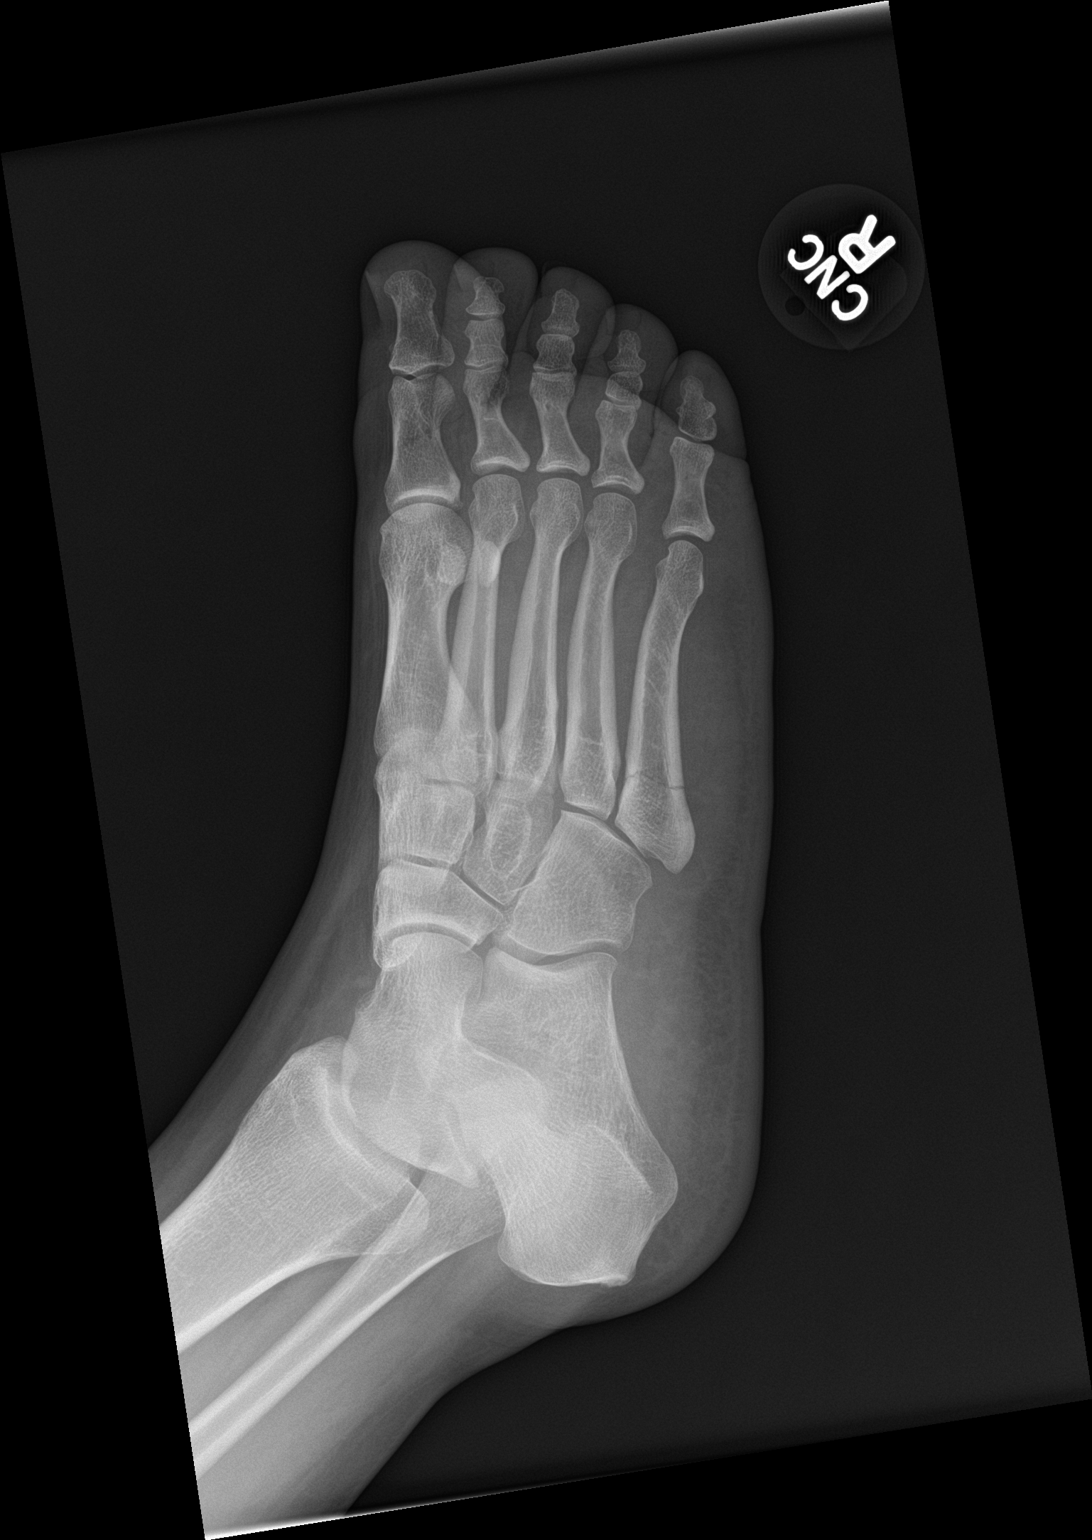

[foot lat]
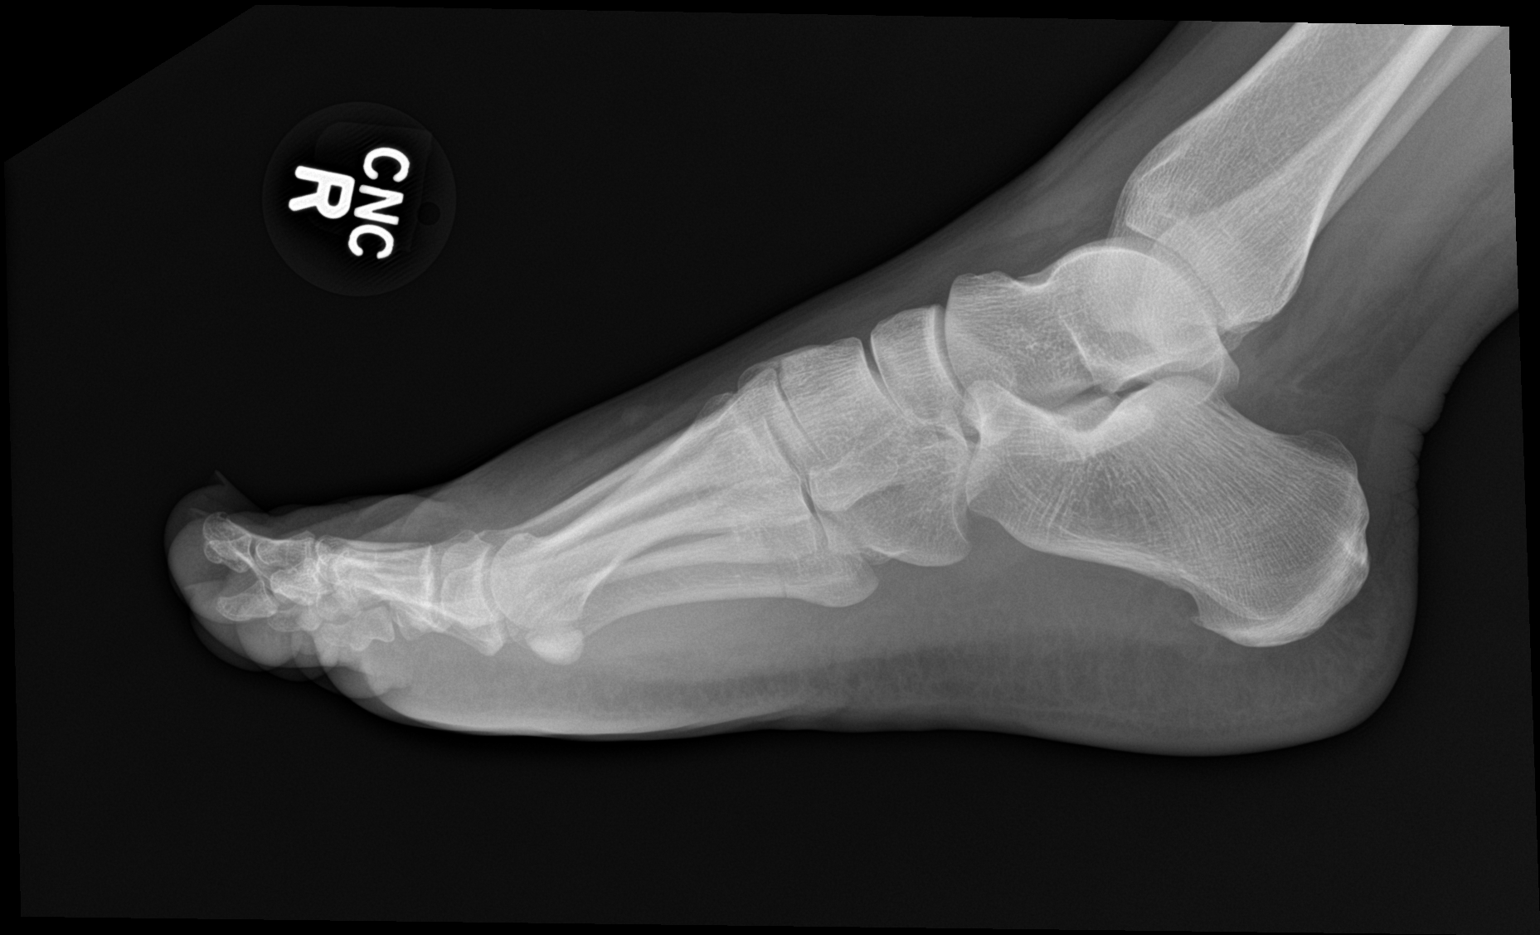

[3 of 3 positions shown; findings below may reference images not displayed]

FINDINGS: There is a nondisplaced transverse fracture through the base of the
right fifth metatarsal with soft tissue swelling. No other acute
abnormality is seen. Alignment is normal. Joint spaces appear
normal.
IMPRESSION: Nondisplaced transverse fracture through the base of the right fifth
metatarsal.

## 2019-09-07 ENCOUNTER — Other Ambulatory Visit: Payer: Self-pay

## 2019-09-07 ENCOUNTER — Emergency Department: Payer: Self-pay

## 2019-09-07 ENCOUNTER — Emergency Department
Admission: EM | Admit: 2019-09-07 | Discharge: 2019-09-08 | Disposition: A | Discharge status: Home / Self Care | Payer: Self-pay | Attending: Emergency Medicine | Admitting: Emergency Medicine

## 2019-09-07 DIAGNOSIS — R05 Cough: Secondary | ICD-10-CM | POA: Insufficient documentation

## 2019-09-07 DIAGNOSIS — R0981 Nasal congestion: Secondary | ICD-10-CM | POA: Insufficient documentation

## 2019-09-07 DIAGNOSIS — Z20822 Contact with and (suspected) exposure to covid-19: Secondary | ICD-10-CM | POA: Insufficient documentation

## 2019-09-07 DIAGNOSIS — J029 Acute pharyngitis, unspecified: Secondary | ICD-10-CM | POA: Insufficient documentation

## 2019-09-07 DIAGNOSIS — J069 Acute upper respiratory infection, unspecified: Secondary | ICD-10-CM | POA: Insufficient documentation

## 2019-09-07 LAB — SARS CORONAVIRUS 2 BY RT PCR (HOSPITAL ORDER, PERFORMED IN ~~LOC~~ HOSPITAL LAB): SARS Coronavirus 2: NEGATIVE

## 2019-09-07 MED ORDER — BENZONATATE 100 MG PO CAPS: 100.0000 mg | ORAL_CAPSULE | Freq: Three times a day (TID) | ORAL | 0 refills | Status: AC | PRN

## 2019-09-07 MED ORDER — ACETAMINOPHEN 325 MG PO TABS
650.0000 mg | ORAL_TABLET | Freq: Once | ORAL | Status: AC
  Administered 2019-09-08: 650 mg via ORAL
  Filled 2019-09-07: qty 2

## 2019-09-07 MED ORDER — ALBUTEROL SULFATE HFA 108 (90 BASE) MCG/ACT IN AERS: 2.0000 | INHALATION_SPRAY | Freq: Four times a day (QID) | RESPIRATORY_TRACT | 0 refills | Status: DC | PRN

## 2019-09-07 NOTE — ED Triage Notes (Signed)
Reports fever, cough, congestion and runny nose for 2 days.

## 2019-09-07 NOTE — ED Notes (Signed)
See triage note- pt with congestion, cough, and sore throat since Saturday. Hasn't been able to eat anything solid since.

## 2019-09-07 NOTE — ED Provider Notes (Signed)
Emergency Department Provider Note  ____________________________________________  Time seen: Approximately 10:19 PM  I have reviewed the triage vital signs and the nursing notes.   HISTORY  Chief Complaint Fever and Cough   Historian Patient   HPI Barbara Huff is a 34 y.o. female presents to the emergency department with nasal congestion, nonproductive cough and pharyngitis for the past 2 days.  Patient's daughter has been sick with similar symptoms.  No chest pain, chest tightness, abdominal pain or vomiting.  No recent travel.  Patient does have concerns for COVID-19.   Past Medical History:  Diagnosis Date  . SAB (spontaneous abortion)      Immunizations up to date:  Yes.     Past Medical History:  Diagnosis Date  . SAB (spontaneous abortion)     Patient Active Problem List   Diagnosis Date Noted  . Encounter for induction of labor 08/15/2017  . Diet controlled gestational diabetes mellitus (GDM) in third trimester 08/01/2017  . 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 Obesity (BMI 30.0-34.9) 12/27/2016  . Acute pyelonephritis 06/24/2015  . Status post laparoscopy 06/17/2015  . Unruptured tubal pregnancy 06/10/2015    Past Surgical History:  Procedure Laterality Date  . CESAREAN SECTION N/A 08/16/2017   Procedure: CESAREAN SECTION;  Surgeon: Linzie Collin, MD;  Location: [facility] ORS;  Service: Obstetrics;  Laterality: N/A;  . DIAGNOSTIC LAPAROSCOPY WITH REMOVAL OF ECTOPIC PREGNANCY Left 06/10/2015   Procedure: DIAGNOSTIC LAPAROSCOPY WITH REMOVAL OF ECTOPIC PREGNANCY;  Surgeon: Herold Harms, MD;  Location: [facility] ORS;  Service: Gynecology;  Laterality: Left;  . LAPAROSCOPIC LYSIS OF ADHESIONS N/A 06/10/2015   Procedure: LAPAROSCOPIC LYSIS OF ADHESIONS;  Surgeon: Herold Harms, MD;  Location: [facility] ORS;  Service: Gynecology;  Laterality: N/A;    Prior to Admission medications   Medication Sig Start Date End Date Taking? Authorizing Provider   albuterol (VENTOLIN HFA) 108 (90 Base) MCG/ACT inhaler Inhale 2 puffs into the lungs every 6 (six) hours as needed for wheezing or shortness of breath. 09/07/19   Orvil Feil, PA-C  amoxicillin (AMOXIL) 500 MG capsule Take 1 capsule (500 mg total) by mouth 3 (three) times daily. 09/12/18   Menshew, Charlesetta Ivory, PA-C  benzonatate (TESSALON PERLES) 100 MG capsule Take 1 capsule (100 mg total) by mouth 3 (three) times daily as needed for up to 7 days for cough. 09/07/19 09/14/19  Orvil Feil, PA-C  levonorgestrel (MIRENA) 20 MCG/24HR IUD 1 each by Intrauterine route once.    [provider]    Allergies Codeine  Family History  Problem Relation Age of Onset  . Asthma Maternal Grandmother   . Colon cancer Maternal Grandmother   . Diabetes Maternal Grandmother   . Ovarian cancer Neg Hx     Social History Social History   Tobacco Use  . Smoking status: Never Smoker  . Smokeless tobacco: Never Used  Vaping Use  . Vaping Use: Never used  Substance Use Topics  . Alcohol use: No  . Drug use: No      Review of Systems  Constitutional: Patient has fever.  Eyes: No visual changes. No discharge ENT: Patient has congestion.  Cardiovascular: no chest pain. Respiratory: Patient has cough.  Gastrointestinal: No abdominal pain.  No nausea, no vomiting. Patient had diarrhea.  Genitourinary: Negative for dysuria. No hematuria Musculoskeletal: Patient has myalgias.  Skin: Negative for rash, abrasions, lacerations, ecchymosis. Neurological: Patient has headache, no focal weakness or numbness.      ____________________________________________  PHYSICAL EXAM:  VITAL SIGNS: ED Triage Vitals  Enc Vitals Group     BP 09/07/19 2109 131/85     Pulse Rate 09/07/19 2109 (!) 105     Resp 09/07/19 2109 18     Temp 09/07/19 2109 98.9 F (37.2 C)     Temp Source 09/07/19 2109 Oral     SpO2 09/07/19 2109 99 %     Weight 09/07/19 2107 189 lb (85.7 kg)     Height 09/07/19  2107 4\' 11"  (1.499 m)     Head Circumference --      Peak Flow --      Pain Score 09/07/19 2151 3     Pain Loc --      Pain Edu? --      Excl. in GC? --      Constitutional: Alert and oriented. Patient is lying supine. Eyes: Conjunctivae are normal. PERRL. EOMI. Head: Atraumatic. ENT:      Ears: Tympanic membranes are mildly injected with mild effusion bilaterally.       Nose: No congestion/rhinnorhea.      Mouth/Throat: Mucous membranes are moist. Posterior pharynx is mildly erythematous.  Hematological/Lymphatic/Immunilogical: No cervical lymphadenopathy.  Cardiovascular: Normal rate, regular rhythm. Normal S1 and S2.  Good peripheral circulation. Respiratory: Normal respiratory effort without tachypnea or retractions. Lungs CTAB. Good air entry to the bases with no decreased or absent breath sounds. Gastrointestinal: Bowel sounds 4 quadrants. Soft and nontender to palpation. No guarding or rigidity. No palpable masses. No distention. No CVA tenderness. Musculoskeletal: Full range of motion to all extremities. No gross deformities appreciated. Neurologic:  Normal speech and language. No gross focal neurologic deficits are appreciated.  Skin:  Skin is warm, dry and intact. No rash noted. Psychiatric: Mood and affect are normal. Speech and behavior are normal. Patient exhibits appropriate insight and judgement.    ____________________________________________   LABS (all labs ordered are listed, but only abnormal results are displayed)  Labs Reviewed  SARS CORONAVIRUS 2 BY RT PCR (HOSPITAL ORDER, PERFORMED IN [facility])   ____________________________________________  EKG   ____________________________________________  RADIOLOGY 2152, personally viewed and evaluated these images (plain radiographs) as part of my medical decision making, as well as reviewing the written report by the radiologist.    DG Chest 1 View  Result Date:  09/07/2019 CLINICAL DATA:  , Congestion EXAM: CHEST  1 VIEW COMPARISON:  06/12/2018 FINDINGS: Heart and mediastinal contours are within normal limits. No focal opacities or effusions. No acute bony abnormality. IMPRESSION: No active disease. Electronically Signed   By: 06/14/2018 M.D.   On: 09/07/2019 22:29    ____________________________________________    PROCEDURES  Procedure(s) performed:     Procedures     Medications  acetaminophen (TYLENOL) tablet 650 mg (has no administration in time range)     ____________________________________________   INITIAL IMPRESSION / ASSESSMENT AND PLAN / ED COURSE  Pertinent labs & imaging results that were available during my care of the patient were reviewed by me and considered in my medical decision making (see chart for details).    Assessment and Plan: Viral URI with Cough:  34 year old female 34 year old female presents to the ED with viral uri like symptoms. Chest xray shows no signs of community acquired pneumonia. Covid 19 testing was negative. Rest and hydration were encouraged. Patient was discharged with albuterol and Tessalon Perles.    ____________________________________________  FINAL CLINICAL IMPRESSION(S) / ED DIAGNOSES  Final diagnoses:  Viral URI with cough      NEW MEDICATIONS STARTED DURING THIS VISIT:  ED Discharge Orders         Ordered    albuterol (VENTOLIN HFA) 108 (90 Base) MCG/ACT inhaler  Every 6 hours PRN     Discontinue  Reprint     09/07/19 2359    benzonatate (TESSALON PERLES) 100 MG capsule  3 times daily PRN     Discontinue  Reprint     09/07/19 2359              This chart was dictated using voice recognition software/Dragon. Despite best efforts to proofread, errors can occur which can change the meaning. Any change was purely unintentional.     Orvil Feil, PA-C 09/08/19 Cristy Friedlander, MD 09/08/19 0010

## 2019-09-08 MED ORDER — ONDANSETRON 4 MG PO TBDP: 4.0000 mg | ORAL_TABLET | Freq: Once | ORAL | Status: AC

## 2019-09-08 MED ORDER — ONDANSETRON 4 MG PO TBDP
ORAL_TABLET | ORAL | Status: AC
  Administered 2019-09-08: 4 mg via ORAL
  Filled 2019-09-08: qty 1

## 2020-03-28 IMAGING — CT CT ANGIO CHEST
2 of 6 series · 18 of 46 positions shown · IV contrast (iopamidol)
Comparison: None.

CLINICAL DATA: 32 y/o F; history of C-section 2 weeks ago. Patient
presents with sudden onset of chest pain radiating to the upper back
3 hours ago. Shortness of breath.

EXAM:
CT ANGIOGRAPHY CHEST WITH CONTRAST
TECHNIQUE: Multidetector CT imaging of the chest was performed using the
standard protocol during bolus administration of intravenous
contrast. Multiplanar CT image reconstructions and MIPs were
obtained to evaluate the vascular anatomy.
CONTRAST:  75mL R46FW7-LHR IOPAMIDOL (R46FW7-LHR) INJECTION 76%

[Series 5: thins · axial · 0.67mm/px · z∈[-672,-458]mm · 15 of 235 slices shown]
[im 11/235  lung]
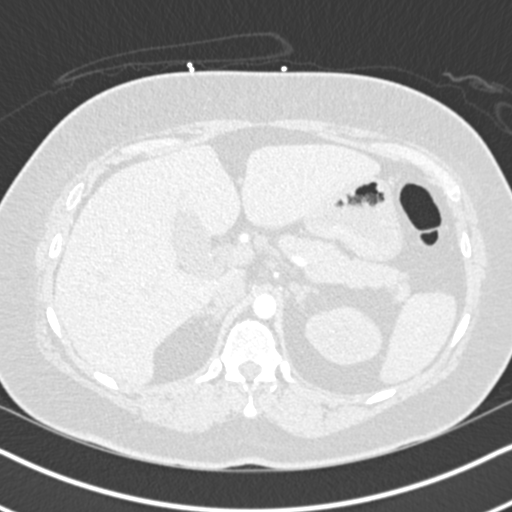
[im 31/235  soft-tissue]
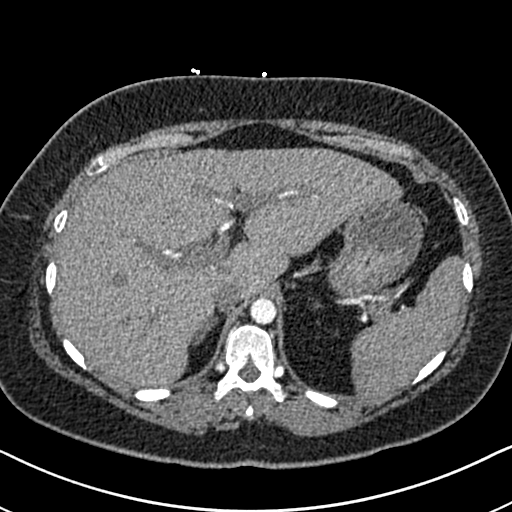
[im 41/235  lung]
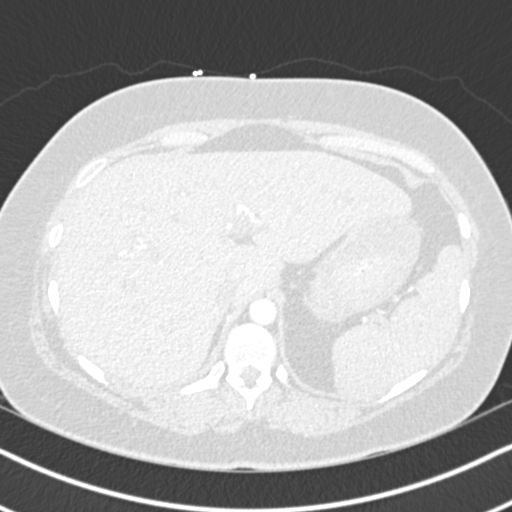
[im 62/235  soft-tissue]
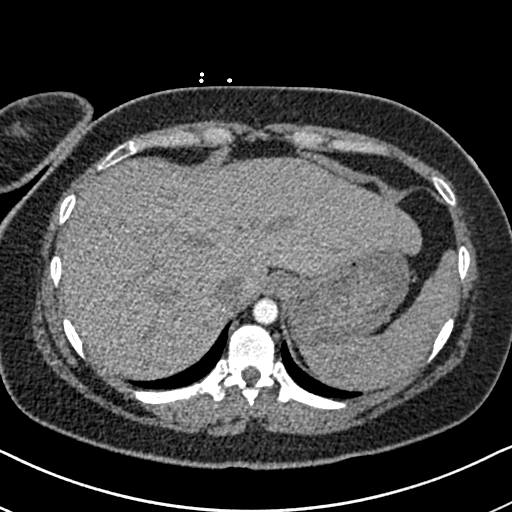
[im 72/235  lung]
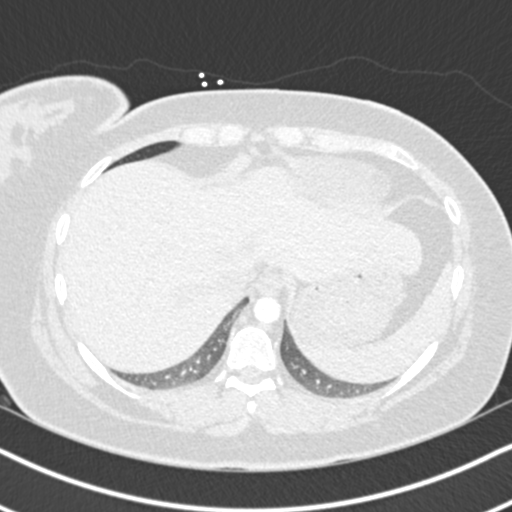
[im 92/235  soft-tissue]
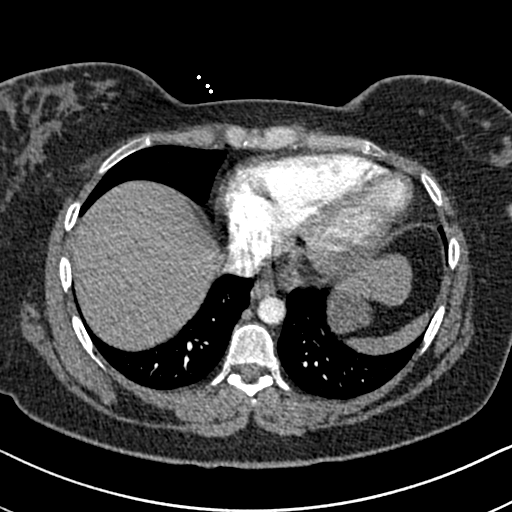
[im 102/235  lung]
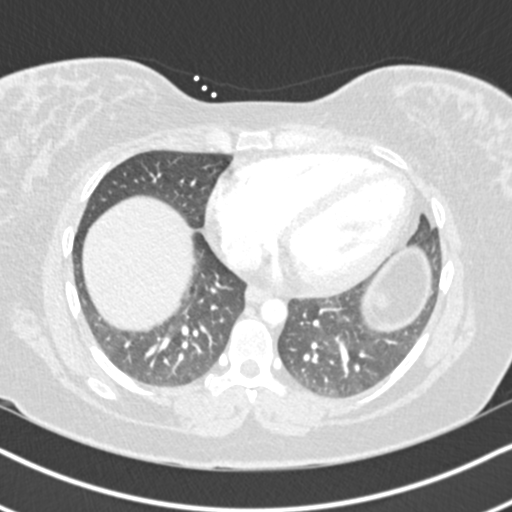
[im 123/235  soft-tissue]
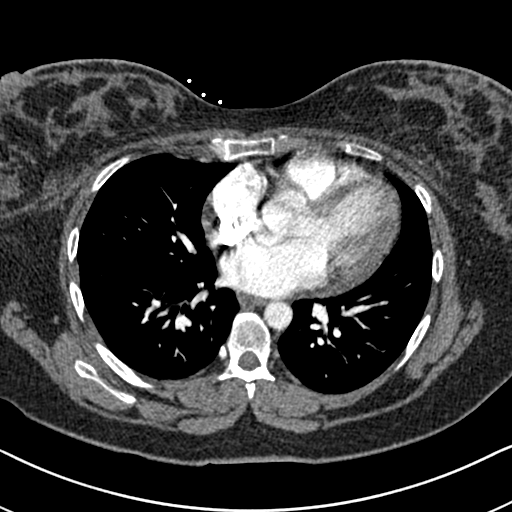
[im 133/235  lung]
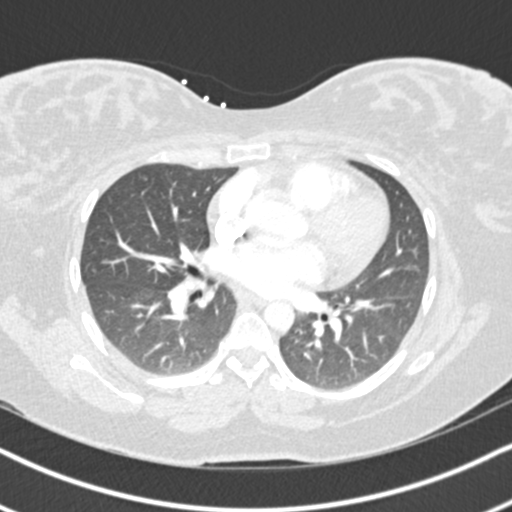
[im 143/235  soft-tissue]
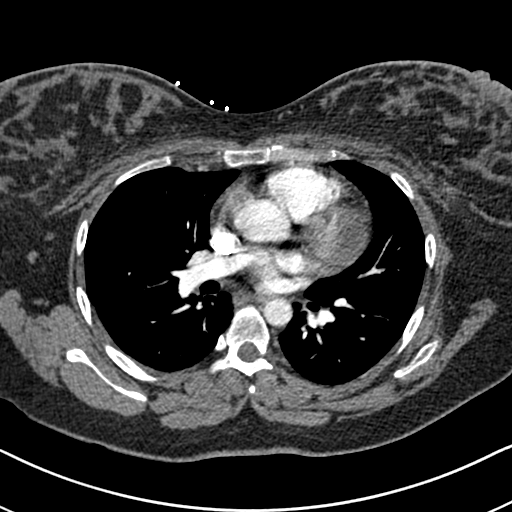
[im 163/235  lung]
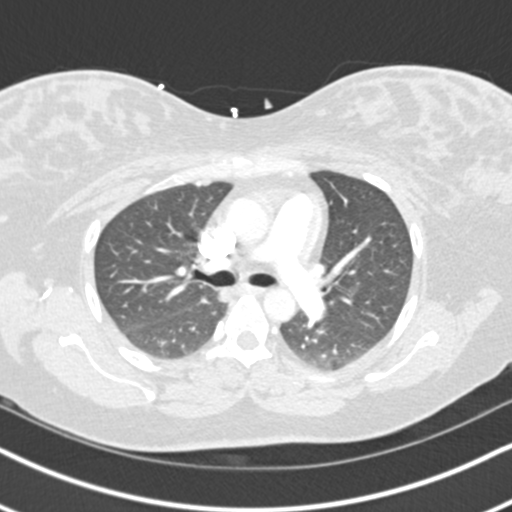
[im 173/235  soft-tissue]
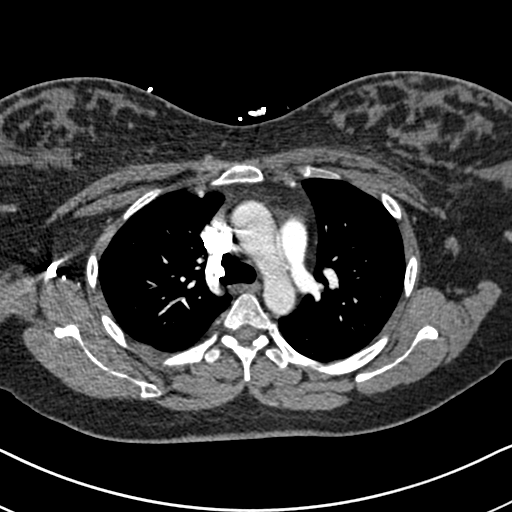
[im 194/235  lung]
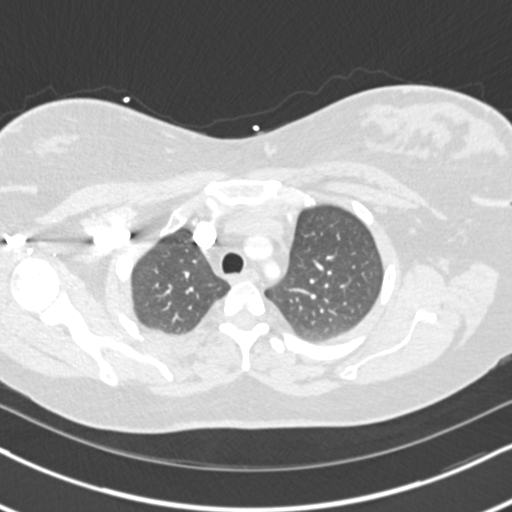
[im 204/235  soft-tissue]
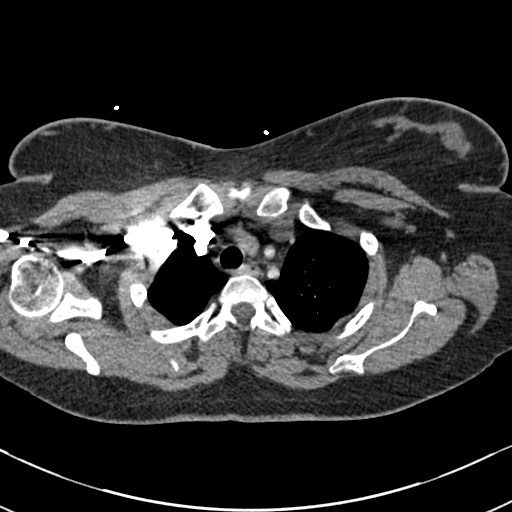
[im 224/235  lung]
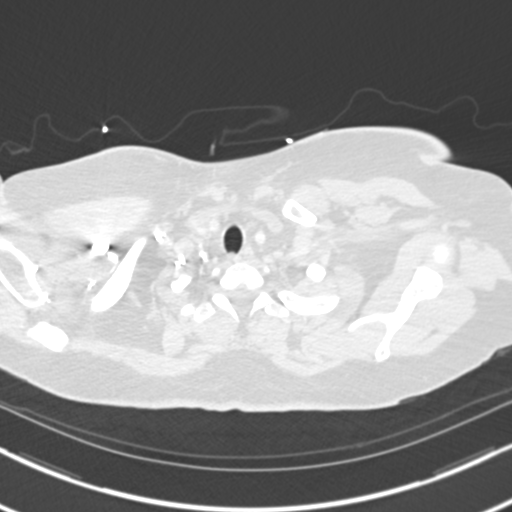

[Series 7: coronal mpr · coronal · 0.49mm/px · 3 of 81 slices shown]
[im 21/81  soft-tissue]
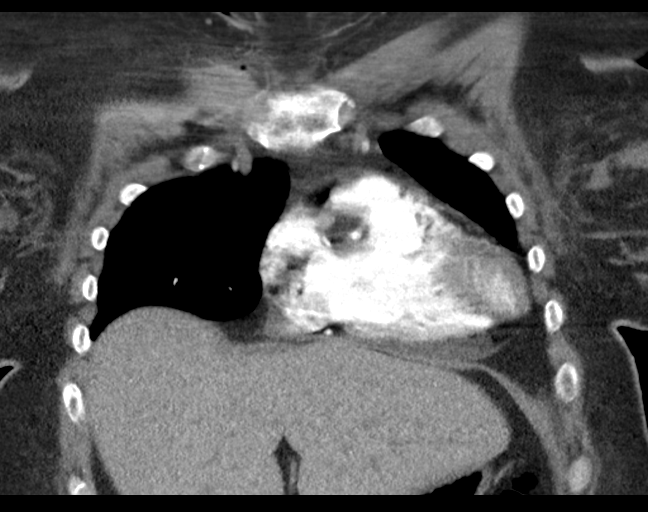
[im 41/81  soft-tissue]
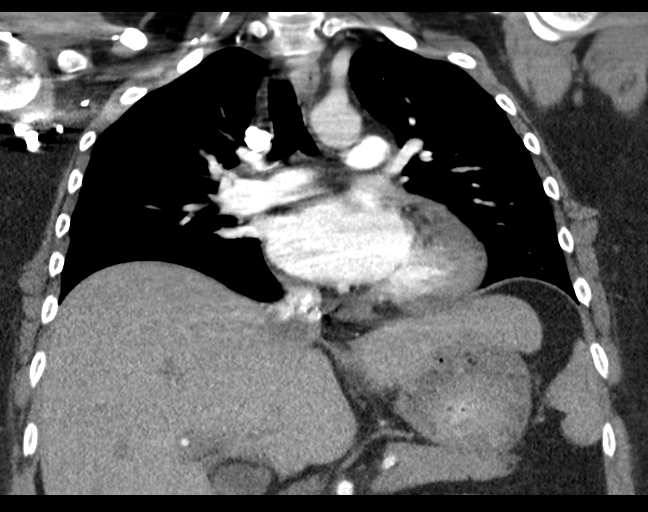
[im 61/81  soft-tissue]
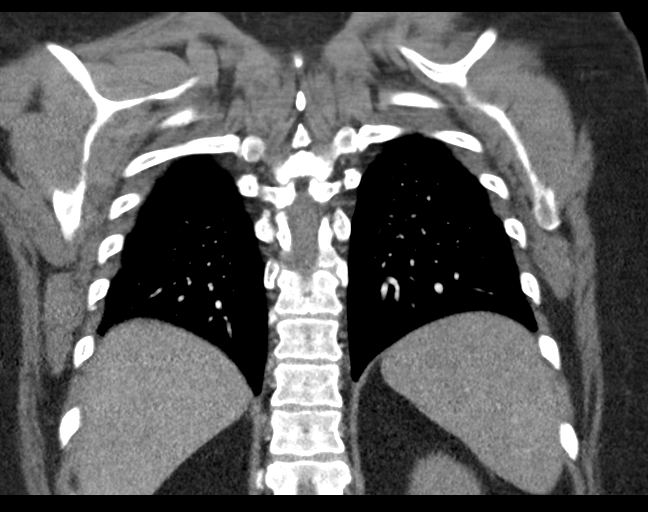

[18 of 46 positions shown; findings below may reference images not displayed]

FINDINGS: Cardiovascular: Satisfactory opacification of the pulmonary arteries
to the segmental level. No evidence of pulmonary embolism. Normal
heart size. No pericardial effusion.

Mediastinum/Nodes: No enlarged mediastinal, hilar, or axillary lymph
nodes. Thyroid gland, trachea, and esophagus demonstrate no
significant findings.

Lungs/Pleura: Lungs are clear. No pleural effusion or pneumothorax.

Upper Abdomen: No acute abnormality.

Musculoskeletal: No chest wall abnormality. No acute or significant
osseous findings.

Review of the MIP images confirms the above findings.
IMPRESSION: No pulmonary embolus identified.  Normal CTA of the chest.

By: Deanne Tiger M.D.

## 2022-02-06 NOTE — Progress Notes (Deleted)
    GYNECOLOGY OFFICE PROCEDURE NOTE  Barbara Huff is a 37 y.o. [phone] here for [place] IUD removal. No GYN concerns.  Last pap smear was on 02/07/2017 and was normal.  IUD Removal  Patient identified, informed consent performed, consent signed.  Patient was in the dorsal lithotomy position, normal external genitalia was noted.  A speculum was placed in the patient's vagina, normal discharge was noted, no lesions. The cervix was visualized, no lesions, no abnormal discharge.  The strings of the IUD were grasped and pulled using ring forceps. The IUD was removed in its entirety. *** (The strings of the IUD were not visualized, so Kelly forceps were introduced into the endometrial cavity and the IUD was grasped and removed in its entirety).  Patient tolerated the procedure well.    Patient will use *** for contraception/***plans for pregnancy soon and she was told to avoid teratogens, take PNV and folic acid.  Routine preventative health maintenance measures emphasized.    Rubie Maid, MD [place]

## 2022-02-07 ENCOUNTER — Telehealth: Payer: Self-pay | Admitting: Certified Nurse Midwife

## 2022-02-07 ENCOUNTER — Ambulatory Visit: Payer: Medicaid Other | Admitting: Obstetrics and Gynecology

## 2022-02-07 NOTE — Telephone Encounter (Signed)
I contacted patient via phoen. No answer, was unable to leave message to see about reschedule.

## 2022-02-07 NOTE — Telephone Encounter (Signed)
-----   Message from Rubie Maid, MD sent at 02/07/2022  1:46 PM EST ----- Regarding: Schedule appointment Please contact patient. Notes she had a 4:15 appt with me today for IUD removal, attempted to call the office to reschedule due to kids being out of school. Please contact to either reschedule, or let her know if she still desires appt today she can bring her child with her.    Dr. Marcelline Mates

## 2022-02-10 NOTE — Telephone Encounter (Signed)
"  The number you dialed is not in service, please the number and call again". No answer, no voicemail.

## 2022-09-13 ENCOUNTER — Encounter: Payer: Self-pay | Admitting: Obstetrics and Gynecology

## 2022-09-13 ENCOUNTER — Ambulatory Visit (INDEPENDENT_AMBULATORY_CARE_PROVIDER_SITE_OTHER): Payer: Medicaid Other | Admitting: Obstetrics and Gynecology

## 2022-09-13 ENCOUNTER — Other Ambulatory Visit (HOSPITAL_COMMUNITY)
Admission: RE | Admit: 2022-09-13 | Discharge: 2022-09-13 | Disposition: A | Discharge status: Home / Self Care | Payer: Medicaid Other | Source: Ambulatory Visit | Attending: Obstetrics and Gynecology | Admitting: Obstetrics and Gynecology

## 2022-09-13 VITALS — BP 146/96 | HR 92 | Ht 59.5 in | Wt 188.3 lb

## 2022-09-13 DIAGNOSIS — Z124 Encounter for screening for malignant neoplasm of cervix: Secondary | ICD-10-CM | POA: Insufficient documentation

## 2022-09-13 DIAGNOSIS — Z01419 Encounter for gynecological examination (general) (routine) without abnormal findings: Secondary | ICD-10-CM | POA: Insufficient documentation

## 2022-09-13 DIAGNOSIS — Z30432 Encounter for removal of intrauterine contraceptive device: Secondary | ICD-10-CM

## 2022-09-13 NOTE — Progress Notes (Signed)
    GYNECOLOGY OFFICE PROCEDURE NOTE  Barbara Huff is a 37 y.o. [phone] here for Mirena IUD removal. No GYN concerns.  Last pap smear was on 02/07/2017 and was normal.  IUD Removal  Patient identified, informed consent performed, consent signed.  Patient was in the dorsal lithotomy position, normal external genitalia was noted.  A speculum was placed in the patient's vagina, normal discharge was noted, no lesions. The cervix was visualized, no lesions, no abnormal discharge.  The strings of the IUD were grasped and pulled using tonsil forceps (as IUD threads were visualized just at the level of the cervical os). The IUD was removed in its entirety.  Patient tolerated the procedure well.    Patient will use no contraception, she and her husband plan for pregnancy soon and she was told to avoid teratogens, take PNV and folic acid.  I also discussed use of Vitex, improving overall health (improving healthy diet choices and initiating exercise of some form), tracking cycles.  Patient is overdue for routine preventative health maintenance, however can perform physical once patient has become pregnant.  Proceeded to complete Pap smear today for screening as patient was also overdue for this.  Routine preventative health maintenance measures emphasized.    Hildred Laser, MD [facility]

## 2022-09-18 LAB — CYTOLOGY - PAP
Adequacy: ABSENT
Comment: NEGATIVE
Diagnosis: NEGATIVE
High risk HPV: NEGATIVE

## 2024-01-14 ENCOUNTER — Ambulatory Visit: Payer: Self-pay

## 2024-01-14 VITALS — BP 132/104 | HR 82 | Ht 59.0 in | Wt 189.0 lb

## 2024-01-14 DIAGNOSIS — Z3687 Encounter for antenatal screening for uncertain dates: Secondary | ICD-10-CM

## 2024-01-14 DIAGNOSIS — N912 Amenorrhea, unspecified: Secondary | ICD-10-CM

## 2024-01-14 LAB — POCT URINE PREGNANCY: Preg Test, Ur: POSITIVE — AB

## 2024-01-14 NOTE — Progress Notes (Signed)
° ° °  NURSE VISIT NOTE  Subjective:    Patient ID: Barbara Huff, female    DOB: May 14, 1985, 38 y.o.   MRN: [id number]  HPI  Patient is a 38 y.o. G20P1021 female who presents for evaluation of amenorrhea. She believes she could be pregnant. Pregnancy is desired. Sexual Activity: single partner, contraception: none. Current symptoms also include: positive home pregnancy test. Last period was normal.    Objective:    BP (!) 132/104   Pulse 82   Ht 4' 11 (1.499 m)   Wt 189 lb (85.7 kg)   LMP 12/10/2023   BMI 38.17 kg/m   Lab Review  Results for orders placed or performed in visit on 01/14/24  POCT urine pregnancy  Result Value Ref Range   Preg Test, Ur Positive (A) Negative    Assessment:   1. Absence of menstruation   2. Encounter for antenatal screening for uncertain dates     Plan:   Pregnancy Test: Positive  Estimated Date of Delivery: 09/15/2024 . BP Cuff Measurement taken. Cuff Size Adult Small Encouraged well-balanced diet, plenty of rest when needed, pre-natal vitamins daily and walking for exercise.  Discussed self-help for nausea, avoiding OTC medications until consulting provider or pharmacist, other than Tylenol  as needed, minimal caffeine (1-2 cups daily) and avoiding alcohol.   She will schedule her nurse visit @ 7-[redacted] wks pregnant, u/s for dating @10  wk, and NOB visit at [redacted] wk pregnant.    Feel free to call with any questions.    Rollo JINNY Maxin, CMA

## 2024-01-14 NOTE — Patient Instructions (Signed)
 Common Medications Safe in Pregnancy  Acne:      Constipation:  Benzoyl Peroxide     Colace  Clindamycin      Dulcolax Suppository  Topica Erythromycin     Fibercon  Salicylic Acid      Metamucil         Miralax AVOID:        Senakot   Accutane    Cough:  Retin-A       Cough Drops  Tetracycline      Phenergan w/ Codeine if Rx  Minocycline      Robitussin (Plain & DM)  Antibiotics:     Crabs/Lice:  Ceclor       RID  Cephalosporins    AVOID:  E-Mycins      Kwell  Keflex  Macrobid/Macrodantin   Diarrhea:  Penicillin      Kao-Pectate  Zithromax      Imodium AD         PUSH FLUIDS AVOID:       Cipro     Fever:  Tetracycline      Tylenol (Regular or Extra  Minocycline       Strength)  Levaquin      Extra Strength-Do not          Exceed 8 tabs/24 hrs Caffeine:        200mg /day (equiv. To 1 cup of coffee or  approx. 3 12 oz sodas)         Gas: Cold/Hayfever:       Gas-X  Benadryl      Mylicon  Claritin       Phazyme  **Claritin-D        Chlor-Trimeton    Headaches:  Dimetapp      ASA-Free Excedrin  Drixoral-Non-Drowsy     Cold Compress  Mucinex (Guaifenasin)     Tylenol (Regular or Extra  Sudafed/Sudafed-12 Hour     Strength)  **Sudafed PE Pseudoephedrine   Tylenol Cold & Sinus     Vicks Vapor Rub  Zyrtec  **AVOID if Problems With Blood Pressure         Heartburn: Avoid lying down for at least 1 hour after meals  Aciphex      Maalox     Rash:  Milk of Magnesia     Benadryl    Mylanta       1% Hydrocortisone Cream  Pepcid  Pepcid Complete   Sleep Aids:  Prevacid      Ambien   Prilosec       Benadryl  Rolaids       Chamomile Tea  Tums (Limit 4/day)     Unisom         Tylenol PM         Warm milk-add vanilla or  Hemorrhoids:       Sugar for taste  Anusol/Anusol H.C.  (RX: Analapram 2.5%)  Sugar Substitutes:  Hydrocortisone OTC     Ok in moderation  Preparation H      Tucks        Vaseline lotion applied to tissue with  wiping    Herpes:     Throat:  Acyclovir      Oragel  Famvir  Valtrex     Vaccines:         Flu Shot Leg Cramps:       *Gardasil  Benadryl      Hepatitis A         Hepatitis B Nasal Spray:  Pneumovax  Saline Nasal Spray     Polio Booster         Tetanus Nausea:       Tuberculosis test or PPD  Vitamin B6 25 mg TID   AVOID:    Dramamine      *Gardasil  Emetrol       Live Poliovirus  Ginger Root 250 mg QID    MMR (measles, mumps &  High Complex Carbs @ Bedtime    rebella)  Sea Bands-Accupressure    Varicella (Chickenpox)  Unisom 1/2 tab TID     *No known complications           If received before Pain:         Known pregnancy;   Darvocet       Resume series after  Lortab        Delivery  Percocet    Yeast:   Tramadol      Femstat  Tylenol 3      Gyne-lotrimin  Ultram       Monistat  Vicodin           MISC:         All Sunscreens           Hair Coloring/highlights          Insect Repellant's          (Including DEET)         Mystic Tans First Trimester of Pregnancy  The first trimester of pregnancy starts on the first day of your last monthly period until the end of week 13. This is months 1 through 3 of pregnancy. A week after a sperm fertilizes an egg, the egg will implant into the wall of the uterus and begin to develop into a baby. Body changes during your first trimester Your body goes through many changes during pregnancy. The changes usually return to normal after your baby is born. Physical changes Your breasts may grow larger and may hurt. The area around your nipples may get darker. Your periods will stop. Your hair and nails may grow faster. You may pee more often. Health changes You may tire easily. Your gums may bleed and may be sensitive when you brush and floss. You may not feel hungry. You may have heartburn. You may throw up or feel like you may throw up. You may want to eat some foods, but not others. You may have headaches. You may have  trouble pooping (constipation). Other changes Your emotions may change from day to day. You may have more dreams. Follow these instructions at home: Medicines Talk to your health care provider if you're taking medicines. Ask if the medicines are safe to take during pregnancy. Your provider may change the medicines that you take. Do not take any medicines unless told to by your provider. Take a prenatal vitamin that has at least 600 micrograms (mcg) of folic acid. Do not use herbal medicines, illegal substances, or medicines that are not approved by your provider. Eating and drinking While you're pregnant your body needs extra food for your growing baby. Talk with your provider about what to eat while pregnant. Activity Most women are able to exercise during pregnancy. Exercises may need to change as your pregnancy goes on. Talk to your provider about your activities and exercise routines. Relieving pain and discomfort Wear a good, supportive bra if your breasts hurt. Rest with your legs raised if you have leg cramps or low back pain. Safety  Wear your seatbelt at all times when you're in a car. Talk to your provider if someone hits you, hurts you, or yells at you. Talk with your provider if you're feeling sad or have thoughts of hurting yourself. Lifestyle Certain things can be harmful while you're pregnant. Follow these rules: Do not use hot tubs, steam rooms, or saunas. Do not douche. Do not use tampons or scented pads. Do not drink alcohol,smoke, vape, or use products with nicotine or tobacco in them. If you need help quitting, talk with your provider. Avoid cat litter boxes and soil used by cats. These things carry germs that can cause harm to your pregnancy and your baby. General instructions Keep all follow-up visits. It helps you and your unborn baby stay as healthy as possible. Write down your questions. Take them to your visits. Your provider will: Talk with you about your  overall health. Give you advice or refer you to specialists who can help with different needs, including: Prenatal education classes. Mental health and counseling. Foods and healthy eating. Ask for help if you need help with food. Call your dentist and ask to be seen. Brush your teeth with a soft toothbrush. Floss gently. Where to find more information American Pregnancy Association: americanpregnancy.org [employer]necologists: acog.org Office on [employer] Health: [employer] Contact a health care provider if: You feel dizzy, faint, or have a fever. You vomit or have watery poop (diarrhea) for 2 days or more. You have abnormal discharge or bleeding from your vagina. You have pain when you pee or your pee smells bad. You have cramps, pain, or pressure in your belly area. Get help right away if: You have trouble breathing or chest pain. You have any kind of injury, such as from a fall or a car crash. These symptoms may be an emergency. Get help right away. Call 911. Do not wait to see if the symptoms will go away. Do not drive yourself to the hospital. This information is not intended to replace advice given to you by your health care provider. Make sure you discuss any questions you have with your health care provider. Document Revised: 10/19/2022 Document Reviewed: 05/19/2022 Elsevier Patient Education  2024 [employer].

## 2024-01-25 ENCOUNTER — Telehealth: Payer: Self-pay | Admitting: Certified Nurse Midwife

## 2024-01-25 NOTE — Telephone Encounter (Signed)
 Contacted the patient via phone, to scheduled an earlier ultrasound appointment per Zelda T. Offering Friday, 1/9 at 11:15 am or 4 pm. Left message for the patient to contact the office for scheduling.

## 2024-01-25 NOTE — Telephone Encounter (Signed)
 The patient has been scheduled for 02/08/23 for ultrasound

## 2024-01-25 NOTE — Telephone Encounter (Signed)
I contacted patient via phone. I left voicemail for patient to call back to be scheduled.   

## 2024-01-29 ENCOUNTER — Emergency Department: Payer: Self-pay | Admitting: Certified Registered"

## 2024-01-29 ENCOUNTER — Other Ambulatory Visit: Payer: Self-pay

## 2024-01-29 ENCOUNTER — Encounter
Admission: EM | Disposition: A | Discharge status: Home / Self Care | Payer: Self-pay | Source: Home / Self Care | Attending: Emergency Medicine

## 2024-01-29 ENCOUNTER — Telehealth: Payer: Self-pay

## 2024-01-29 ENCOUNTER — Ambulatory Visit
Admission: EM | Admit: 2024-01-29 | Discharge: 2024-01-29 | Disposition: A | Discharge status: Home / Self Care | Payer: Self-pay | Attending: Emergency Medicine | Admitting: Emergency Medicine

## 2024-01-29 ENCOUNTER — Emergency Department: Payer: Self-pay

## 2024-01-29 DIAGNOSIS — Z9889 Other specified postprocedural states: Secondary | ICD-10-CM

## 2024-01-29 DIAGNOSIS — O24410 Gestational diabetes mellitus in pregnancy, diet controlled: Secondary | ICD-10-CM | POA: Insufficient documentation

## 2024-01-29 DIAGNOSIS — O09291 Supervision of pregnancy with other poor reproductive or obstetric history, first trimester: Secondary | ICD-10-CM | POA: Insufficient documentation

## 2024-01-29 DIAGNOSIS — N736 Female pelvic peritoneal adhesions (postinfective): Secondary | ICD-10-CM | POA: Diagnosis present

## 2024-01-29 DIAGNOSIS — Z3A01 Less than 8 weeks gestation of pregnancy: Secondary | ICD-10-CM | POA: Insufficient documentation

## 2024-01-29 DIAGNOSIS — E66811 Obesity, class 1: Secondary | ICD-10-CM | POA: Diagnosis present

## 2024-01-29 DIAGNOSIS — O00101 Right tubal pregnancy without intrauterine pregnancy: Secondary | ICD-10-CM

## 2024-01-29 DIAGNOSIS — O009 Unspecified ectopic pregnancy without intrauterine pregnancy: Secondary | ICD-10-CM | POA: Diagnosis present

## 2024-01-29 DIAGNOSIS — O09521 Supervision of elderly multigravida, first trimester: Secondary | ICD-10-CM | POA: Insufficient documentation

## 2024-01-29 HISTORY — PX: DIAGNOSTIC LAPAROSCOPY WITH REMOVAL OF ECTOPIC PREGNANCY: SHX6449

## 2024-01-29 HISTORY — PX: LAPAROSCOPIC UNILATERAL SALPINGECTOMY: SHX5934

## 2024-01-29 LAB — BASIC METABOLIC PANEL WITH GFR
Anion gap: 15 (ref 5–15)
BUN: 11 mg/dL (ref 6–20)
CO2: 18 mmol/L — ABNORMAL LOW (ref 22–32)
Calcium: 9.3 mg/dL (ref 8.9–10.3)
Chloride: 103 mmol/L (ref 98–111)
Creatinine, Ser: 1.01 mg/dL — ABNORMAL HIGH (ref 0.44–1.00)
GFR, Estimated: >60 mL/min
Glucose, Bld: 190 mg/dL — ABNORMAL HIGH (ref 70–99)
Potassium: 3.7 mmol/L (ref 3.5–5.1)
Sodium: 136 mmol/L (ref 135–145)

## 2024-01-29 LAB — CBC WITH DIFFERENTIAL/PLATELET
Abs Immature Granulocytes: 0.08 K/uL — ABNORMAL HIGH (ref 0.00–0.07)
Basophils Absolute: 0 K/uL (ref 0.0–0.1)
Basophils Relative: 0 %
Eosinophils Absolute: 0 K/uL (ref 0.0–0.5)
Eosinophils Relative: 0 %
HCT: 33.8 % — ABNORMAL LOW (ref 36.0–46.0)
Hemoglobin: 11.6 g/dL — ABNORMAL LOW (ref 12.0–15.0)
Immature Granulocytes: 0 %
Lymphocytes Relative: 8 %
Lymphs Abs: 1.6 K/uL (ref 0.7–4.0)
MCH: 28.8 pg (ref 26.0–34.0)
MCHC: 34.3 g/dL (ref 30.0–36.0)
MCV: 83.9 fL (ref 80.0–100.0)
Monocytes Absolute: 0.4 K/uL (ref 0.1–1.0)
Monocytes Relative: 2 %
Neutro Abs: 16.8 K/uL — ABNORMAL HIGH (ref 1.7–7.7)
Neutrophils Relative %: 90 %
Platelets: 368 K/uL (ref 150–400)
RBC: 4.03 MIL/uL (ref 3.87–5.11)
RDW: 12.6 % (ref 11.5–15.5)
WBC: 18.8 K/uL — ABNORMAL HIGH (ref 4.0–10.5)
nRBC: 0 % (ref 0.0–0.2)

## 2024-01-29 LAB — TYPE AND SCREEN
ABO/RH(D): AB POS
Antibody Screen: NEGATIVE

## 2024-01-29 LAB — HCG, QUANTITATIVE, PREGNANCY: hCG, Beta Chain, Quant, S: 21322 m[IU]/mL — ABNORMAL HIGH

## 2024-01-29 SURGERY — LAPAROSCOPY, WITH ECTOPIC PREGNANCY SURGICAL TREATMENT
Anesthesia: General | Site: Abdomen | Laterality: Right

## 2024-01-29 MED ORDER — SODIUM CHLORIDE 0.9 % IR SOLN
Status: DC | PRN
  Administered 2024-01-29: 1000 mL

## 2024-01-29 MED ORDER — OXYCODONE HCL 5 MG PO TABS: 5.0000 mg | ORAL_TABLET | Freq: Four times a day (QID) | ORAL | 0 refills | Status: AC | PRN

## 2024-01-29 MED ORDER — PROPOFOL 10 MG/ML IV BOLUS
INTRAVENOUS | Status: DC | PRN
  Administered 2024-01-29: 150 mg via INTRAVENOUS

## 2024-01-29 MED ORDER — DEXAMETHASONE SOD PHOSPHATE PF 10 MG/ML IJ SOLN
INTRAMUSCULAR | Status: DC | PRN
  Administered 2024-01-29: 10 mg via INTRAVENOUS

## 2024-01-29 MED ORDER — DROPERIDOL 2.5 MG/ML IJ SOLN: 0.6250 mg | Freq: Once | INTRAMUSCULAR | Status: DC | PRN

## 2024-01-29 MED ORDER — PROPOFOL 10 MG/ML IV BOLUS
INTRAVENOUS | Status: AC
  Filled 2024-01-29: qty 20

## 2024-01-29 MED ORDER — ONDANSETRON HCL 4 MG/2ML IJ SOLN
INTRAMUSCULAR | Status: AC
  Filled 2024-01-29: qty 2

## 2024-01-29 MED ORDER — SUGAMMADEX SODIUM 200 MG/2ML IV SOLN
INTRAVENOUS | Status: DC | PRN
  Administered 2024-01-29: 200 mg via INTRAVENOUS

## 2024-01-29 MED ORDER — CHLORHEXIDINE GLUCONATE 0.12 % MT SOLN
OROMUCOSAL | Status: AC
  Filled 2024-01-29: qty 15

## 2024-01-29 MED ORDER — POVIDONE-IODINE 10 % EX SWAB
2.0000 | Freq: Once | CUTANEOUS | Status: AC
  Administered 2024-01-29: 2 via TOPICAL

## 2024-01-29 MED ORDER — PHENYLEPHRINE HCL-NACL 20-0.9 MG/250ML-% IV SOLN
INTRAVENOUS | Status: DC | PRN
  Administered 2024-01-29: 25 ug/min via INTRAVENOUS

## 2024-01-29 MED ORDER — LACTATED RINGERS IV SOLN: INTRAVENOUS | Status: DC

## 2024-01-29 MED ORDER — BUPIVACAINE HCL (PF) 0.5 % IJ SOLN
INTRAMUSCULAR | Status: AC
  Filled 2024-01-29: qty 30

## 2024-01-29 MED ORDER — SODIUM CHLORIDE 0.9 % IV SOLN: Freq: Once | INTRAVENOUS | Status: AC

## 2024-01-29 MED ORDER — KETOROLAC TROMETHAMINE 30 MG/ML IJ SOLN
INTRAMUSCULAR | Status: AC
  Filled 2024-01-29: qty 1

## 2024-01-29 MED ORDER — HEMOSTATIC AGENTS (NO CHARGE) OPTIME
TOPICAL | Status: DC | PRN
  Administered 2024-01-29: 1 via TOPICAL

## 2024-01-29 MED ORDER — FENTANYL CITRATE (PF) 100 MCG/2ML IJ SOLN
INTRAMUSCULAR | Status: AC
  Filled 2024-01-29: qty 2

## 2024-01-29 MED ORDER — SUCCINYLCHOLINE CHLORIDE 200 MG/10ML IV SOSY
PREFILLED_SYRINGE | INTRAVENOUS | Status: DC | PRN
  Administered 2024-01-29: 100 mg via INTRAVENOUS

## 2024-01-29 MED ORDER — CHLORHEXIDINE GLUCONATE 0.12 % MT SOLN
15.0000 mL | Freq: Once | OROMUCOSAL | Status: AC
  Administered 2024-01-29: 15 mL via OROMUCOSAL

## 2024-01-29 MED ORDER — ACETAMINOPHEN 500 MG PO TABS
1000.0000 mg | ORAL_TABLET | ORAL | Status: AC
  Administered 2024-01-29: 1000 mg via ORAL

## 2024-01-29 MED ORDER — FENTANYL CITRATE (PF) 100 MCG/2ML IJ SOLN
INTRAMUSCULAR | Status: DC | PRN
  Administered 2024-01-29 (×2): 50 ug via INTRAVENOUS

## 2024-01-29 MED ORDER — BUPIVACAINE HCL 0.5 % IJ SOLN
INTRAMUSCULAR | Status: DC | PRN
  Administered 2024-01-29: 30 mL

## 2024-01-29 MED ORDER — PHENYLEPHRINE 80 MCG/ML (10ML) SYRINGE FOR IV PUSH (FOR BLOOD PRESSURE SUPPORT)
PREFILLED_SYRINGE | INTRAVENOUS | Status: DC | PRN
  Administered 2024-01-29: 80 ug via INTRAVENOUS
  Administered 2024-01-29 (×2): 160 ug via INTRAVENOUS

## 2024-01-29 MED ORDER — 0.9 % SODIUM CHLORIDE (POUR BTL) OPTIME
TOPICAL | Status: DC | PRN
  Administered 2024-01-29: 500 mL

## 2024-01-29 MED ORDER — FENTANYL CITRATE (PF) 50 MCG/ML IJ SOSY
50.0000 ug | PREFILLED_SYRINGE | Freq: Once | INTRAMUSCULAR | Status: AC
  Administered 2024-01-29: 50 ug via INTRAVENOUS
  Filled 2024-01-29: qty 1

## 2024-01-29 MED ORDER — MIDAZOLAM HCL (PF) 2 MG/2ML IJ SOLN
INTRAMUSCULAR | Status: DC | PRN
  Administered 2024-01-29: 2 mg via INTRAVENOUS

## 2024-01-29 MED ORDER — ROCURONIUM BROMIDE 100 MG/10ML IV SOLN
INTRAVENOUS | Status: DC | PRN
  Administered 2024-01-29: 40 mg via INTRAVENOUS
  Administered 2024-01-29: 20 mg via INTRAVENOUS

## 2024-01-29 MED ORDER — LIDOCAINE HCL (CARDIAC) PF 100 MG/5ML IV SOSY
PREFILLED_SYRINGE | INTRAVENOUS | Status: DC | PRN
  Administered 2024-01-29: 80 mg via INTRAVENOUS

## 2024-01-29 MED ORDER — OXYCODONE HCL 5 MG PO TABS
ORAL_TABLET | ORAL | Status: AC
  Filled 2024-01-29: qty 1

## 2024-01-29 MED ORDER — ALBUMIN HUMAN 5 % IV SOLN: INTRAVENOUS | Status: DC | PRN

## 2024-01-29 MED ORDER — PHENYLEPHRINE HCL-NACL 20-0.9 MG/250ML-% IV SOLN
INTRAVENOUS | Status: AC
  Filled 2024-01-29: qty 250

## 2024-01-29 MED ORDER — ACETAMINOPHEN 500 MG PO TABS
ORAL_TABLET | ORAL | Status: AC
  Filled 2024-01-29: qty 2

## 2024-01-29 MED ORDER — MIDAZOLAM HCL 2 MG/2ML IJ SOLN
INTRAMUSCULAR | Status: AC
  Filled 2024-01-29: qty 2

## 2024-01-29 MED ORDER — FENTANYL CITRATE (PF) 100 MCG/2ML IJ SOLN
25.0000 ug | INTRAMUSCULAR | Status: DC | PRN
  Administered 2024-01-29 (×2): 25 ug via INTRAVENOUS

## 2024-01-29 MED ORDER — KETOROLAC TROMETHAMINE 30 MG/ML IJ SOLN
INTRAMUSCULAR | Status: DC | PRN
  Administered 2024-01-29: 30 mg via INTRAVENOUS

## 2024-01-29 MED ORDER — ONDANSETRON HCL 4 MG/2ML IJ SOLN
INTRAMUSCULAR | Status: DC | PRN
  Administered 2024-01-29: 4 mg via INTRAVENOUS

## 2024-01-29 MED ORDER — OXYCODONE HCL 5 MG PO TABS
5.0000 mg | ORAL_TABLET | Freq: Four times a day (QID) | ORAL | Status: DC | PRN
  Administered 2024-01-29: 5 mg

## 2024-01-29 MED ORDER — SODIUM CHLORIDE (PF) 0.9 % IJ SOLN
INTRAMUSCULAR | Status: AC
  Filled 2024-01-29: qty 20

## 2024-01-29 SURGICAL SUPPLY — 61 items
BAG URINE DRAIN 2000ML AR STRL (UROLOGICAL SUPPLIES) ×3 IMPLANT
BLADE SURG SZ11 CARB STEEL (BLADE) ×3 IMPLANT
CATH URTH 16FR FL 2W BLN LF (CATHETERS) ×3 IMPLANT
CNTNR URN SCR LID CUP LEK RST (MISCELLANEOUS) ×3 IMPLANT
CORD HIGH FREQUENCY UNIPOLAR (ELECTROSURGICAL) IMPLANT
DEFOGGER SCOPE WARM SEASHARP (MISCELLANEOUS) ×3 IMPLANT
DERMABOND ADVANCED .7 DNX12 (GAUZE/BANDAGES/DRESSINGS) ×3 IMPLANT
DRAPE LAPAROTOMY TRNSV 106X77 (MISCELLANEOUS) ×3 IMPLANT
DRAPE STERI POUCH LG 24X46 STR (DRAPES) IMPLANT
DRSG TELFA 3X8 NADH STRL (GAUZE/BANDAGES/DRESSINGS) ×3 IMPLANT
ELECT BLADE 6 FLAT ULTRCLN (ELECTRODE) IMPLANT
ELECTRODE REM PT RTRN 9FT ADLT (ELECTROSURGICAL) ×3 IMPLANT
GAUZE 4X4 16PLY ~~LOC~~+RFID DBL (SPONGE) ×6 IMPLANT
GLOVE BIO SURGEON STRL SZ7 (GLOVE) ×6 IMPLANT
GLOVE INDICATOR 7.5 STRL GRN (GLOVE) ×3 IMPLANT
GLOVE SURG SYN 8.0 PF PI (GLOVE) ×3 IMPLANT
GOWN STRL REUS W/ TWL LRG LVL3 (GOWN DISPOSABLE) ×6 IMPLANT
GOWN STRL REUS W/ TWL XL LVL3 (GOWN DISPOSABLE) ×3 IMPLANT
IRRIGATION STRYKERFLOW (MISCELLANEOUS) IMPLANT
IV 0.9% NACL 1000 ML (IV SOLUTION) ×3 IMPLANT
KIT PINK PAD W/HEAD ARM REST (MISCELLANEOUS) ×3 IMPLANT
KIT TURNOVER KIT A (KITS) ×3 IMPLANT
LABEL OR SOLS (LABEL) ×3 IMPLANT
LHOOK LAP DISP 36CM (ELECTROSURGICAL) IMPLANT
LIGASURE VESSEL 5MM BLUNT TIP (ELECTROSURGICAL) IMPLANT
MANIFOLD NEPTUNE II (INSTRUMENTS) ×3 IMPLANT
MANIPULATOR UTERINE 4.5 ZUMI (MISCELLANEOUS) IMPLANT
NEEDLE HYPO 21X1.5 SAFETY (NEEDLE) ×3 IMPLANT
NS IRRIG 500ML POUR BTL (IV SOLUTION) ×3 IMPLANT
PACK BASIN MAJOR ARMC (MISCELLANEOUS) ×3 IMPLANT
PACK GYN LAPAROSCOPIC (MISCELLANEOUS) ×3 IMPLANT
PAD PREP OB/GYN DISP 24X41 (PERSONAL CARE ITEMS) ×3 IMPLANT
POWDER SURGICEL 3.0 GRAM (HEMOSTASIS) IMPLANT
SCISSORS LAP 5X35 DISP (ENDOMECHANICALS) IMPLANT
SCRUB CHG 4% DYNA-HEX 4OZ (MISCELLANEOUS) ×3 IMPLANT
SET TUBE SMOKE EVAC HIGH FLOW (TUBING) ×3 IMPLANT
SLEEVE Z-THREAD 5X100MM (TROCAR) ×3 IMPLANT
SOLN STERILE WATER 500 ML (IV SOLUTION) ×3 IMPLANT
SOLN STERILE WATER BTL 1000 ML (IV SOLUTION) ×3 IMPLANT
SOLUTION PREP PVP 2OZ (MISCELLANEOUS) ×3 IMPLANT
SPONGE KITTNER 5P (MISCELLANEOUS) ×3 IMPLANT
SPONGE T-LAP 18X18 ~~LOC~~+RFID (SPONGE) ×6 IMPLANT
STAPLER INSORB 30 2030 C-SECTI (MISCELLANEOUS) IMPLANT
SUT MNCRL AB 4-0 PS2 18 (SUTURE) ×3 IMPLANT
SUT VIC AB 0 CT1 27XCR 8 STRN (SUTURE) IMPLANT
SUT VIC AB 0 CT1 36 (SUTURE) ×6 IMPLANT
SUT VIC AB 2-0 CT1 (SUTURE) IMPLANT
SUT VIC AB 2-0 SH 27XBRD (SUTURE) IMPLANT
SUT VIC AB 3-0 SH 27X BRD (SUTURE) ×3 IMPLANT
SUT VICRYL PLUS ABS 0 54 (SUTURE) IMPLANT
SYR 10ML LL (SYRINGE) ×3 IMPLANT
SYR 30ML LL (SYRINGE) ×3 IMPLANT
SYR 50ML LL SCALE MARK (SYRINGE) IMPLANT
SYR 5ML LL (SYRINGE) IMPLANT
SYR BULB IRRIG 60ML STRL (SYRINGE) ×3 IMPLANT
SYSTEM BAG RETRIEVAL 10MM (BASKET) IMPLANT
TIP ENDOSCOPIC SURGICEL (TIP) IMPLANT
TRAP FLUID SMOKE EVACUATOR (MISCELLANEOUS) ×3 IMPLANT
TRAY FOLEY MTR SLVR 16FR STAT (SET/KITS/TRAYS/PACK) ×3 IMPLANT
TROCAR Z-THREAD FIOS 5X100MM (TROCAR) ×3 IMPLANT
TUBING ART PRESS 48 MALE/FEM (TUBING) IMPLANT

## 2024-01-29 NOTE — Telephone Encounter (Signed)
 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 Nurses checked her blood pressure and it was very low. She states she is having this again. She's feeling hot and clammy. Inquiring what to do. Advised this is not an OB/GYN issue. Advised to see PCP, if she does not have PCP, she should be seen at Urgent Care or ED. Patient inquired if it is ok for her to take stool softener. Advised ok as long as she is not allergic.

## 2024-01-29 NOTE — ED Notes (Signed)
 Patient transported to Ultrasound

## 2024-01-29 NOTE — ED Provider Notes (Signed)
 "  [facility] Provider Note    Event Date/Time   First MD Initiated Contact with Patient 01/29/24 1324     (approximate)   History   Abdominal Pain and Loss of Consciousness   HPI  Barbara Huff is a 38 y.o. female with a history of ectopic pregnancy who presents with complaints of right lower quadrant abdominal/pelvic pain that started 2 days ago with syncopal episode today and nausea.  She reports significant pain in the right pelvis     Physical Exam   Triage Vital Signs: ED Triage Vitals  Encounter Vitals Group     BP 01/29/24 1320 (!) 65/48     Girls Systolic BP Percentile --      Girls Diastolic BP Percentile --      Boys Systolic BP Percentile --      Boys Diastolic BP Percentile --      Pulse Rate 01/29/24 1320 84     Resp 01/29/24 1320 (!) 22     Temp 01/29/24 1320 (!) 97.4 F (36.3 C)     Temp Source 01/29/24 1320 Oral     SpO2 01/29/24 1320 100 %     Weight --      Height --      Head Circumference --      Peak Flow --      Pain Score 01/29/24 1322 8     Pain Loc --      Pain Education --      Exclude from Growth Chart --     Most recent vital signs: Vitals:   01/29/24 1345 01/29/24 1400  BP: 92/62 107/79  Pulse: (!) 56 60  Resp: (!) 22 19  Temp:    SpO2: 100% 100%     General: Awake, no distress.  CV:  Good peripheral perfusion.  Resp:  Normal effort.  Abd:  No distention.  Tenderness in the right lower quadrant Other:     ED Results / Procedures / Treatments   Labs (all labs ordered are listed, but only abnormal results are displayed) Labs Reviewed  BASIC METABOLIC PANEL WITH GFR - Abnormal; Notable for the following components:      Result Value   CO2 18 (*)    Glucose, Bld 190 (*)    Creatinine, Ser 1.01 (*)    All other components within normal limits  CBC WITH DIFFERENTIAL/PLATELET - Abnormal; Notable for the following components:   WBC 18.8 (*)    Hemoglobin 11.6 (*)    HCT 33.8 (*)    Neutro Abs  16.8 (*)    Abs Immature Granulocytes 0.08 (*)    All other components within normal limits  HCG, QUANTITATIVE, PREGNANCY - Abnormal; Notable for the following components:   hCG, Beta Chain, Quant, S 21,322 (*)    All other components within normal limits  URINALYSIS, ROUTINE W REFLEX MICROSCOPIC  POC URINE PREG, ED  TYPE AND SCREEN     EKG  ED ECG REPORT I, Lamar Price, the attending physician, personally viewed and interpreted this ECG.  Date: 01/29/2024  Rhythm: normal sinus rhythm QRS Axis: normal Intervals: normal ST/T Wave abnormalities: normal Narrative Interpretation: no evidence of acute ischemia    RADIOLOGY Ultrasound pending    PROCEDURES:  Critical Care performed: yes  CRITICAL CARE Performed by: Lamar Price   Total critical care time: 30 minutes  Critical care time was exclusive of separately billable procedures and treating other patients.  Critical care was necessary to  treat or prevent imminent or life-threatening deterioration.  Critical care was time spent personally by me on the following activities: development of treatment plan with patient and/or surrogate as well as nursing, discussions with consultants, evaluation of patient's response to treatment, examination of patient, obtaining history from patient or surrogate, ordering and performing treatments and interventions, ordering and review of laboratory studies, ordering and review of radiographic studies, pulse oximetry and re-evaluation of patient's condition.   Procedures   MEDICATIONS ORDERED IN ED: Medications  0.9 %  sodium chloride  infusion ( Intravenous New Bag/Given 01/29/24 1329)  fentaNYL  (SUBLIMAZE ) injection 50 mcg (50 mcg Intravenous Given 01/29/24 1509)     IMPRESSION / MDM / ASSESSMENT AND PLAN / ED COURSE  I reviewed the triage vital signs and the nursing notes. Patient's presentation is most consistent with acute presentation with potential threat to life or  bodily function.  Patient with right-sided pelvic pain in the setting of early pregnancy with hypotension, syncope, concern for ruptured ectopic.  IV fluids infusing, type and screen sent, stat ultrasound ordered.  Consulted GYN  ----------------------------------------- 2:55 PM on 01/29/2024 ----------------------------------------- Patient's blood pressure has improved, she is getting ultrasound now.  Hemoglobin is reassuring.  Nurse midwife has seen the patient and Dr. Fredirick is aware  ----------------------------------------- 3:50 PM on 01/29/2024 -----------------------------------------  Ultrasound is consistent with ectopic pregnancy, Dr. Fredirick is here        FINAL CLINICAL IMPRESSION(S) / ED DIAGNOSES   Final diagnoses:  Right tubal pregnancy, unspecified whether intrauterine pregnancy present     Rx / DC Orders   ED Discharge Orders     None        Note:  This document was prepared using Dragon voice recognition software and may include unintentional dictation errors.   Arlander Charleston, MD 01/29/24 1550  "

## 2024-01-29 NOTE — H&P (Signed)
 Barbara Huff is an 38 y.o. [phone] female.   Chief Complaint: right sided abdominal pain.  HPI: pt. Is pregnant. By LMP is 6-7 weeks. She has had abd. Pain since 3 days ago. Noted syncope this am. H/o right ectopic with salpingectomy prior. H/o C-section x 1. Some bleeding this am.  Past Medical History:  Diagnosis Date   Acute pyelonephritis 06/24/2015   Diet controlled gestational diabetes mellitus (GDM) in third trimester 08/01/2017   SAB (spontaneous abortion)    Unruptured tubal pregnancy 06/10/2015    Past Surgical History:  Procedure Laterality Date   CESAREAN SECTION N/A 08/16/2017   Procedure: CESAREAN SECTION;  Surgeon: Barbara Alm Agent, MD;  Location: [facility] ORS;  Service: Obstetrics;  Laterality: N/A;   DIAGNOSTIC LAPAROSCOPY WITH REMOVAL OF ECTOPIC PREGNANCY Left 06/10/2015   Procedure: DIAGNOSTIC LAPAROSCOPY WITH REMOVAL OF ECTOPIC PREGNANCY;  Surgeon: Barbara DELENA Dollar, MD;  Location: [facility] ORS;  Service: Gynecology;  Laterality: Left;   LAPAROSCOPIC LYSIS OF ADHESIONS N/A 06/10/2015   Procedure: LAPAROSCOPIC LYSIS OF ADHESIONS;  Surgeon: Barbara DELENA Dollar, MD;  Location: [facility] ORS;  Service: Gynecology;  Laterality: N/A;    Family History  Problem Relation Age of Onset   Hypertension Maternal Grandmother    Colon cancer Maternal Grandmother    Diabetes Maternal Grandmother    Breast cancer Maternal Grandmother        73s   Cervical cancer Maternal Grandmother        unknown age   Ovarian cancer Neg Hx    Social History:  reports that she has never smoked. She has never used smokeless tobacco. She reports that she does not drink alcohol and does not use drugs.  Allergies: Allergies[1]   Pertinent items are noted in HPI.  Blood pressure 92/62, pulse (!) 56, temperature (!) 97.4 F (36.3 C), temperature source Oral, resp. rate (!) 22, last menstrual period 12/10/2023, SpO2 100%. Vitals:   01/29/24 1330 01/29/24 1335 01/29/24 1340 01/29/24 1345  BP: (!) 81/58    92/62  Pulse: 64 60 (!) 59 (!) 56  Resp: 19 11 17  (!) 22  Temp:      TempSrc:      SpO2: 100% 100% 100% 100%    General appearance: alert, cooperative, and appears stated age Head: Normocephalic, without obvious abnormality, atraumatic Lungs: normal effort Heart: regular rate and rhythm Abdomen: soft, tender to palpation Skin: Skin color, texture, turgor normal. No rashes or lesions Neurologic: Grossly normal   Results for orders placed or performed during the hospital encounter of 01/29/24 (from the past 24 hours)  Basic metabolic panel     Status: Abnormal   Collection Time: 01/29/24  1:33 PM  Result Value Ref Range   Sodium 136 135 - 145 mmol/L   Potassium 3.7 3.5 - 5.1 mmol/L   Chloride 103 98 - 111 mmol/L   CO2 18 (L) 22 - 32 mmol/L   Glucose, Bld 190 (H) 70 - 99 mg/dL   BUN 11 6 - 20 mg/dL   Creatinine, Ser 8.98 (H) 0.44 - 1.00 mg/dL   Calcium 9.3 8.9 - 89.6 mg/dL   GFR, Estimated >39 >39 mL/min   Anion gap 15 5 - 15  CBC with Differential     Status: Abnormal   Collection Time: 01/29/24  1:33 PM  Result Value Ref Range   WBC 18.8 (H) 4.0 - 10.5 K/uL   RBC 4.03 3.87 - 5.11 MIL/uL   Hemoglobin 11.6 (L) 12.0 - 15.0 g/dL   HCT  33.8 (L) 36.0 - 46.0 %   MCV 83.9 80.0 - 100.0 fL   MCH 28.8 26.0 - 34.0 pg   MCHC 34.3 30.0 - 36.0 g/dL   RDW 87.3 88.4 - 84.4 %   Platelets 368 150 - 400 K/uL   nRBC 0.0 0.0 - 0.2 %   Neutrophils Relative % 90 %   Neutro Abs 16.8 (H) 1.7 - 7.7 K/uL   Lymphocytes Relative 8 %   Lymphs Abs 1.6 0.7 - 4.0 K/uL   Monocytes Relative 2 %   Monocytes Absolute 0.4 0.1 - 1.0 K/uL   Eosinophils Relative 0 %   Eosinophils Absolute 0.0 0.0 - 0.5 K/uL   Basophils Relative 0 %   Basophils Absolute 0.0 0.0 - 0.1 K/uL   Immature Granulocytes 0 %   Abs Immature Granulocytes 0.08 (H) 0.00 - 0.07 K/uL  Type and screen [facility]     Status: None (Preliminary result)   Collection Time: 01/29/24  1:33 PM  Result Value Ref Range    ABO/RH(D) PENDING    Antibody Screen PENDING    Sample Expiration      02/01/2024,2359 Performed at [facility], [address] [place], [place] [address]    No results found.  Assessment/Plan Active Problems:   Ruptured ectopic pregnancy   Status post laparoscopy   Pelvic adhesive disease   Obesity (BMI 30.0-34.9)  Pt. With pertinent h/o prior ectopic and removal of left tube.  Hypotensive with response to IVF 1L bolus. Quant 21,322 U/s shows possible ectopic, + free fluid, nothing in the uterus Will move to surgical removal. Discussed impact on fertility. Risks include but are not limited to bleeding, infection, injury to surrounding structures, including bowel, bladder and ureters, blood clots, and death.  Likelihood of success is high.    Barbara Huff 01/29/2024, 2:27 PM        [1]  Allergies Allergen Reactions   Codeine Nausea And Vomiting

## 2024-01-29 NOTE — Anesthesia Procedure Notes (Signed)
 Procedure Name: Intubation Date/Time: 01/29/2024 4:42 PM  Performed by: Rosine Shona Jansky, CRNAPre-anesthesia Checklist: Patient identified, Emergency Drugs available, Suction available and Patient being monitored Patient Re-evaluated:Patient Re-evaluated prior to induction Oxygen Delivery Method: Circle system utilized Preoxygenation: Pre-oxygenation with 100% oxygen Induction Type: IV induction, Rapid sequence and Cricoid Pressure applied Laryngoscope Size: McGrath and 3 Grade View: Grade I Tube type: Oral Tube size: 7.0 mm Number of attempts: 1 Airway Equipment and Method: Stylet and Video-laryngoscopy Placement Confirmation: ETT inserted through vocal cords under direct vision, positive ETCO2 and breath sounds checked- equal and bilateral Secured at: 22 cm Tube secured with: Tape Dental Injury: Teeth and Oropharynx as per pre-operative assessment

## 2024-01-29 NOTE — Anesthesia Preprocedure Evaluation (Signed)
"                                    Anesthesia Evaluation  Patient identified by MRN, date of birth, ID band Patient awake    Reviewed: Allergy & Precautions, H&P , NPO status , Patient's Chart, lab work & pertinent test results, reviewed documented beta blocker date and time   History of Anesthesia Complications Negative for: history of anesthetic complications  Airway Mallampati: I  TM Distance: >3 FB Neck ROM: full    Dental  (+) Dental Advidsory Given, Teeth Intact, Missing   Pulmonary neg pulmonary ROS   Pulmonary exam normal breath sounds clear to auscultation       Cardiovascular Exercise Tolerance: Good negative cardio ROS Normal cardiovascular exam Rhythm:regular Rate:Normal     Neuro/Psych negative neurological ROS  negative psych ROS   GI/Hepatic negative GI ROS, Neg liver ROS,,,  Endo/Other  negative endocrine ROS    Renal/GU negative Renal ROS  negative genitourinary   Musculoskeletal   Abdominal   Peds  Hematology negative hematology ROS (+)   Anesthesia Other Findings Past Medical History: 06/24/2015: Acute pyelonephritis 08/01/2017: Diet controlled gestational diabetes mellitus (GDM) in  third trimester No date: SAB (spontaneous abortion) 06/10/2015: Unruptured tubal pregnancy   Reproductive/Obstetrics (+) Pregnancy ectopic                              Anesthesia Physical Anesthesia Plan  ASA: 2 and emergent  Anesthesia Plan: General   Post-op Pain Management:    Induction: Intravenous, Rapid sequence and Cricoid pressure planned  PONV Risk Score and Plan: 3 and Ondansetron , Dexamethasone , Midazolam  and Treatment may vary due to age or medical condition  Airway Management Planned: Oral ETT  Additional Equipment:   Intra-op Plan:   Post-operative Plan: Extubation in OR  Informed Consent: I have reviewed the patients History and Physical, chart, labs and discussed the procedure including  the risks, benefits and alternatives for the proposed anesthesia with the patient or authorized representative who has indicated his/her understanding and acceptance.     Dental Advisory Given  Plan Discussed with: Anesthesiologist, CRNA and Surgeon  Anesthesia Plan Comments:         Anesthesia Quick Evaluation  "

## 2024-01-29 NOTE — Transfer of Care (Signed)
 Immediate Anesthesia Transfer of Care Note  Patient: Barbara Huff  Procedure(s) Performed: LAPAROSCOPY, WITH ECTOPIC PREGNANCY SURGICAL TREATMENT (Abdomen) SALPINGECTOMY, UNILATERAL, LAPAROSCOPIC (Right: Abdomen)  Patient Location: PACU  Anesthesia Type:General  Level of Consciousness: awake  Airway & Oxygen Therapy: Patient Spontanous Breathing  Post-op Assessment: Report given to RN  Post vital signs: Reviewed and stable  Last Vitals:  Vitals Value Taken Time  BP 132/82 01/29/24 18:20  Temp    Pulse 71 01/29/24 18:22  Resp 31 01/29/24 18:22  SpO2 100 % 01/29/24 18:22  Vitals shown include unfiled device data.  Last Pain:  Vitals:   01/29/24 1607  TempSrc: Temporal  PainSc: 0-No pain         Complications: No notable events documented.

## 2024-01-29 NOTE — Anesthesia Postprocedure Evaluation (Signed)
"   Anesthesia Post Note  Patient: Trenity Pha  Procedure(s) Performed: LAPAROSCOPY, WITH ECTOPIC PREGNANCY SURGICAL TREATMENT (Abdomen) SALPINGECTOMY, UNILATERAL, LAPAROSCOPIC (Right: Abdomen)  Patient location during evaluation: PACU Anesthesia Type: General Level of consciousness: awake and alert Pain management: pain level controlled Vital Signs Assessment: post-procedure vital signs reviewed and stable Respiratory status: spontaneous breathing, nonlabored ventilation, respiratory function stable and patient connected to nasal cannula oxygen Cardiovascular status: blood pressure returned to baseline and stable Postop Assessment: no apparent nausea or vomiting Anesthetic complications: no   No notable events documented.   Last Vitals:  Vitals:   01/29/24 1852 01/29/24 1900  BP:  108/71  Pulse: 72 69  Resp:    Temp:  (!) 36.3 C  SpO2: 98% 100%    Last Pain:  Vitals:   01/29/24 1852  TempSrc:   PainSc: 5                  Prentice Murphy      "

## 2024-01-29 NOTE — ED Notes (Signed)
 Pt back from ultrasound.

## 2024-01-29 NOTE — ED Triage Notes (Signed)
 Pt is 7wk 4day gestation and c/o R lower abdominal since yesterday. Pt reports near syncopal episodes that started Sunday. Pt has hx of ectopic pregnancy.

## 2024-01-29 NOTE — Op Note (Signed)
 PREOPERATIVE DIAGNOSIS: Probable ruptured ectopic pregnancy  POSTOPERATIVE DIAGNOSIS: Same  PROCEDURE: Laparoscopic right salpingectomy   INDICATIONS: 38 y.o. [id number] at Unknown here for with ruptured ectopic pregnancy with blood type AB pos. Patient was counseled regarding need for laparoscopic salpingectomy. Risks of surgery including bleeding which may require transfusion or reoperation, infection, injury to bowel or other surrounding organs, need for additional procedures including laparotomy and other postoperative/anesthesia complications were explained to patient.  Written informed consent was obtained  FINDINGS: Large amount of hemoperitoneum estimated to be about 300 cc of blood and clots.  Dilated right fallopian tube containing ectopic gestation. Small normal appearing uterus, absentleft fallopian tube, right ovary and left ovary. Adhesions of uterus to anterior abdominal wall, and omentum to anterior abdominal wall.  ANESTHESIA: General  SPECIMENS: right fallopian tube to pathology  COMPLICATIONS: None immediate  PROCEDURE IN DETAIL:  The patient was taken to the operating room where general anesthesia was administered and was found to be adequate.  She was placed in the dorsal lithotomy position, and was prepped and draped in a sterile manner.  A Red Rubber catheter was inserted into her bladder and drained unmeasured amount of urine and a uterine manipulator was then advanced into the uterus .  After an adequate timeout was performed, attention was then turned to the patient's abdomen where a 10-mm skin incision was made in the umbilical fold. Fascia and peritoneum were entered sharply.   A Hassan trocar was placed. The laparoscope was introduced.  A survey of the patient's pelvis and abdomen revealed the findings as above.  Two left lower quadrant ports were placed under direct visualization; 5-mm x 2. The omental adhesion was taken down. It was small and taken very close to the  abdominal wall. No bowel was in this adhesion.  Attention was then turned to the right fallopian tube which was grasped and ligated from the underlying mesosalpinx and uterine attachment using the Harmonic instrument in multiple pieces.  Good hemostasis was noted.  The specimen was placed in an EndoCatch bag and removed from the abdomen intact. As much clot and blood as could be was removed with the Nezjhat. The abdomen was desufflated, and all instruments were removed.  The umbilicus incision was closed with Vicryl suture; and all skin incisions were closed with a 3-0 Vicryl subcuticular stitch followed by DermaBond. The patient tolerated the procedure well.  All instruments, needles, and sponge counts were correct x 2. The patient was taken to the recovery room in stable condition.   [name] MD 01/29/2024 6:13 PM

## 2024-01-30 ENCOUNTER — Encounter: Payer: Self-pay | Admitting: Family Medicine

## 2024-02-01 ENCOUNTER — Telehealth: Payer: Self-pay | Admitting: Advanced Practice Midwife

## 2024-02-01 ENCOUNTER — Ambulatory Visit: Payer: Self-pay

## 2024-02-01 NOTE — Telephone Encounter (Signed)
 Reached out to pt to reschedule NOB Nurse Intake appt that was scheduled on 02/01/2024 at 1:15.  Left message for pt to call back to reschedule.

## 2024-02-04 LAB — SURGICAL PATHOLOGY

## 2024-02-04 NOTE — Telephone Encounter (Signed)
 Reached out to pt (2x) to reschedule NOB Nurse Intake appt that was scheduled on 02/01/2024 at 1:15.  Pt stated that she had surgery on 01/29/2024 for an ectopic pregnancy with Dr. Fredirick.  I have cancelled the OB appts.

## 2024-02-08 ENCOUNTER — Other Ambulatory Visit: Payer: Self-pay

## 2024-02-12 NOTE — Patient Instructions (Incomplete)
 Barbara Huff

## 2024-02-13 ENCOUNTER — Encounter: Payer: Self-pay | Admitting: Certified Nurse Midwife

## 2024-02-13 ENCOUNTER — Ambulatory Visit: Payer: Self-pay | Admitting: Certified Nurse Midwife

## 2024-02-13 VITALS — BP 122/92 | HR 88 | Wt 183.9 lb

## 2024-02-13 DIAGNOSIS — Z09 Encounter for follow-up examination after completed treatment for conditions other than malignant neoplasm: Secondary | ICD-10-CM

## 2024-02-13 DIAGNOSIS — Z8759 Personal history of other complications of pregnancy, childbirth and the puerperium: Secondary | ICD-10-CM

## 2024-02-13 DIAGNOSIS — O0889 Other complications following an ectopic and molar pregnancy: Secondary | ICD-10-CM

## 2024-02-13 DIAGNOSIS — O009 Unspecified ectopic pregnancy without intrauterine pregnancy: Secondary | ICD-10-CM

## 2024-02-13 MED ORDER — CEPHALEXIN 500 MG PO CAPS: 500.0000 mg | ORAL_CAPSULE | Freq: Four times a day (QID) | ORAL | 0 refills | Status: AC

## 2024-02-13 NOTE — Progress Notes (Signed)
" ° ° °  OBSTETRICS/GYNECOLOGY POST-OPERATIVE CLINIC VISIT  Subjective:     Krystan Northrop is a 39 y.o. female who presents to the clinic 2 weeks status post right salpingectomy for ectopic pregnancy. Eating a regular diet without difficulty. Bowel movements are normal. Pain to LLQ incision site, stabbing pain with bending, and sharp pain at other times, redness and tender to touch on skin, no discharge. Denies vaginal bleeding. Umbilical site without pain or irritaiton.  The following portions of the patient's history were reviewed and updated as appropriate: allergies, current medications, past family history, past medical history, past social history, past surgical history, and problem list.  Review of Systems Pertinent items are noted in HPI.   Objective:   BP (!) 122/92   Pulse 88   Wt 183 lb 14.4 oz (83.4 kg)   LMP 12/10/2023   Breastfeeding No   BMI 36.52 kg/m  Body mass index is 36.52 kg/m.  General:  alert and no distress  Abdomen: soft, bowel sounds active, non-tender  Incision:   Umbilical site well healed. LLQ incision with ~1.5cm area of erythema surrounding, tender to palpation, skin well approximated, without exudate.    Pathology: SURGICAL PATHOLOGY [facility] [address], [address] [place], [place] [address] Telephone [phone] or [phone] Fax [phone]  REPORT OF SURGICAL PATHOLOGY   Accession #: [id number] Patient Name: EVANEE, LUBRANO Visit # : [id number]  MRN: [id number] Physician: Fredirick Merle DOB/Age [birth year] (Age: 63) Gender: F Collected Date: 01/29/2024 Received Date: 01/30/2024  FINAL DIAGNOSIS       1. Fallopian tube, ectopic pregnancy, right :      FRAGMENT OF FALLOPIAN TUBE SHOWING HEMOSALPINX WITH ATYPICAL IMMATURE HYDROPIC      CHORIONIC VILLI SHOWING TROPHOBLASTIC HYPERPLASIA AND FRAGMENTS OF GESTATIONAL      SAC CONSISTENT WITH PRODUCTS OF CONCEPTION (SEE MICROSCOPIC COMMENT)       DATE SIGNED OUT:  02/04/2024 ELECTRONIC SIGNATURE : Picklesimer Md, Fred , Sports Administrator, Electronic Signature  MICROSCOPIC DESCRIPTION 1. The villi are variably enlarged and edematous with irregular scalloped borders and rare trophoblastic inclusions.  These villi also show an atypical trophoblastic proliferation.  These features raise the possibility of a cytogenetic abnormality including a molar gestation.   Case is reviewed by Dr. Belvie who concurs with the interpretation.  CASE COMMENTS STAINS USED IN DIAGNOSIS: H&E    CLINICAL HISTORY  SPECIMEN(S) OBTAINED 1. Fallopian tube, ectopic pregnancy, Right  SPECIMEN COMMENTS: SPECIMEN CLINICAL INFORMATION: 1. S/P ectopic pregnancy    Gross Description 1. Received fresh and placed in formalin is a 4 x 1.7 x 1.4 cm aggregate of fragmented red-brown fallopian tube with fimbria, there is chorionic villi present. Representative sections in 1 block. mb 01-30-24  Assessment:   Patient s/p laparoscopic R salpingectomy for ectopic pregnancy. (surgery)  Postoperative course complicated by pain at incision to LLQ   Plan:   1. Continue any current medications as instructed by provider. 2. Wound care discussed. Concern for cellulitis given pain & erythema, start keflex . 3. Operative findings again reviewed. Pathology report discussed. Hcg today, will follow results. Briefly discussed implications of molar pregnancy. 4. Activity restrictions: no lifting more than 20 pounds 5. Anticipated return to work: work physicist, medical provided. 6. Follow up: 2 weeks for incision check.    Jayne Harlene CROME, CNM [facility]     "

## 2024-02-14 LAB — HUMAN CHORIONIC GONADOTROPIN(HCG),B-SUBUNIT,QUANTITATIVE): HCG, Beta Chain, Quant, S: 13 m[IU]/mL

## 2024-02-18 ENCOUNTER — Other Ambulatory Visit: Payer: Self-pay

## 2024-02-19 ENCOUNTER — Ambulatory Visit (INDEPENDENT_AMBULATORY_CARE_PROVIDER_SITE_OTHER): Payer: Self-pay | Admitting: Certified Nurse Midwife

## 2024-02-19 VITALS — BP 122/86 | HR 82 | Wt 187.0 lb

## 2024-02-19 DIAGNOSIS — Z09 Encounter for follow-up examination after completed treatment for conditions other than malignant neoplasm: Secondary | ICD-10-CM

## 2024-02-19 DIAGNOSIS — Z8759 Personal history of other complications of pregnancy, childbirth and the puerperium: Secondary | ICD-10-CM

## 2024-02-19 DIAGNOSIS — Z0289 Encounter for other administrative examinations: Secondary | ICD-10-CM

## 2024-02-19 DIAGNOSIS — O009 Unspecified ectopic pregnancy without intrauterine pregnancy: Secondary | ICD-10-CM

## 2024-02-19 DIAGNOSIS — Z9889 Other specified postprocedural states: Secondary | ICD-10-CM

## 2024-02-19 NOTE — Progress Notes (Unsigned)
 "   Patient, No Pcp Per   Chief Complaint  Patient presents with   Work Clearance    HPI:      Barbara Huff is a 39 y.o. [id number] whose LMP was No LMP recorded., presents today for follow up of ectopic pregnancy     Patient Active Problem List   Diagnosis Date Noted   Diet controlled gestational diabetes mellitus (GDM) in third trimester 08/01/2017   Pelvic adhesive disease 12/27/2016   Obesity (BMI 30.0-34.9) 12/27/2016   Status post laparoscopy 06/17/2015   Ruptured ectopic pregnancy 06/10/2015    Past Surgical History:  Procedure Laterality Date   CESAREAN SECTION N/A 08/16/2017   Procedure: CESAREAN SECTION;  Surgeon: Janit Alm Agent, MD;  Location: [facility];  Service: Obstetrics;  Laterality: N/A;   DIAGNOSTIC LAPAROSCOPY WITH REMOVAL OF ECTOPIC PREGNANCY Left 06/10/2015   Procedure: DIAGNOSTIC LAPAROSCOPY WITH REMOVAL OF ECTOPIC PREGNANCY;  Surgeon: Gladis DELENA Dollar, MD;  Location: [facility];  Service: Gynecology;  Laterality: Left;   DIAGNOSTIC LAPAROSCOPY WITH REMOVAL OF ECTOPIC PREGNANCY N/A 01/29/2024   Procedure: LAPAROSCOPY, WITH ECTOPIC PREGNANCY SURGICAL TREATMENT;  Surgeon: Fredirick Glenys RAMAN, MD;  Location: [facility];  Service: Gynecology;  Laterality: N/A;   LAPAROSCOPIC LYSIS OF ADHESIONS N/A 06/10/2015   Procedure: LAPAROSCOPIC LYSIS OF ADHESIONS;  Surgeon: Gladis DELENA Dollar, MD;  Location: [facility];  Service: Gynecology;  Laterality: N/A;   LAPAROSCOPIC UNILATERAL SALPINGECTOMY Right 01/29/2024   Procedure: SALPINGECTOMY, UNILATERAL, LAPAROSCOPIC;  Surgeon: Fredirick Glenys RAMAN, MD;  Location: [facility];  Service: Gynecology;  Laterality: Right;    Family History  Problem Relation Age of Onset   Hypertension Maternal Grandmother    Colon cancer Maternal Grandmother    Diabetes Maternal Grandmother    Breast cancer Maternal Grandmother        83s   Cervical cancer Maternal Grandmother        unknown age   Ovarian cancer Neg Hx     Social History    Socioeconomic History   Marital status: Married    Spouse name: Not on file   Number of children: Not on file   Years of education: Not on file   Highest education level: Not on file  Occupational History   Not on file  Tobacco Use   Smoking status: Never   Smokeless tobacco: Never  Vaping Use   Vaping status: Never Used  Substance and Sexual Activity   Alcohol use: No   Drug use: No   Sexual activity: Yes    Birth control/protection: I.U.D.    Comment: Mirena   Other Topics Concern   Not on file  Social History Narrative   Lives at home with husband   Social Drivers of Health   Tobacco Use: Low Risk (01/29/2024)   Patient History    Smoking Tobacco Use: Never    Smokeless Tobacco Use: Never    Passive Exposure: Not on file  Financial Resource Strain: Not on file  Food Insecurity: Not on file  Transportation Needs: Not on file  Physical Activity: Not on file  Stress: Not on file  Social Connections: Not on file  Intimate Partner Violence: Not on file  Depression (EYV7-0): Not on file  Alcohol Screen: Not on file  Housing: Not on file  Utilities: Not on file  Health Literacy: Not on file    Outpatient Medications Prior to Visit  Medication Sig Dispense Refill   cephALEXin  (KEFLEX ) 500 MG capsule Take 1 capsule (500 mg total) by mouth  4 (four) times daily. 28 capsule 0   oxyCODONE  (OXY IR/ROXICODONE ) 5 MG immediate release tablet Take 1 tablet (5 mg total) by mouth every 6 (six) hours as needed for severe pain (pain score 7-10). (Patient not taking: Reported on 02/19/2024) 12 tablet 0   predniSONE  (STERAPRED UNI-PAK 21 TAB) 10 MG (21) TBPK tablet Take by mouth as directed. (Patient not taking: Reported on 02/19/2024)     No facility-administered medications prior to visit.      ROS:  Review of Systems   OBJECTIVE:   Vitals:  BP 122/86   Pulse 82   Wt 187 lb (84.8 kg)   BMI 37.14 kg/m   Physical Exam  Results: No results found for this or any  previous visit (from the past 24 hours).   Assessment/Plan: There are no diagnoses linked to this encounter.    No orders of the defined types were placed in this encounter.    Rollo JINNY Maxin, CMA 02/19/2024 3:25 PM       "

## 2024-02-20 ENCOUNTER — Encounter: Payer: Self-pay | Admitting: Certified Nurse Midwife

## 2024-02-20 ENCOUNTER — Ambulatory Visit: Payer: Self-pay | Admitting: Certified Nurse Midwife

## 2024-02-27 ENCOUNTER — Ambulatory Visit: Payer: Self-pay | Admitting: Certified Nurse Midwife

## 2024-02-29 ENCOUNTER — Encounter: Payer: Self-pay | Admitting: Advanced Practice Midwife

## 2024-03-04 ENCOUNTER — Other Ambulatory Visit: Payer: Self-pay

## 2024-03-04 ENCOUNTER — Telehealth: Payer: Self-pay | Admitting: Certified Nurse Midwife

## 2024-03-04 NOTE — Telephone Encounter (Signed)
 Reached out to pt to reschedule lab appt that was scheduled on 03/04/24 at 10:40.  Left message for pt to call back to reschedule.

## 2024-03-06 ENCOUNTER — Encounter: Payer: Self-pay | Admitting: Certified Nurse Midwife

## 2024-03-06 NOTE — Telephone Encounter (Signed)
 Reached out to pt (2x) to reschedule lab appt that was scheduled on 03/04/2024 at 10:40.  Left message for pt to call back to reschedule.  Will send a MyChart letter to pt.
